# Patient Record
Sex: Male | Born: 1955 | Race: White | Hispanic: No | Marital: Single | Smoking: Former smoker
Health system: Southern US, Community
[De-identification: ages and names within clinical notes are randomized; demographics above are authoritative.]

## PROBLEM LIST (undated history)

## (undated) DIAGNOSIS — N400 Enlarged prostate without lower urinary tract symptoms: Secondary | ICD-10-CM

## (undated) DIAGNOSIS — I8390 Asymptomatic varicose veins of unspecified lower extremity: Secondary | ICD-10-CM

## (undated) DIAGNOSIS — E785 Hyperlipidemia, unspecified: Secondary | ICD-10-CM

## (undated) DIAGNOSIS — E291 Testicular hypofunction: Secondary | ICD-10-CM

## (undated) DIAGNOSIS — IMO0002 Reserved for concepts with insufficient information to code with codable children: Secondary | ICD-10-CM

## (undated) DIAGNOSIS — I2510 Atherosclerotic heart disease of native coronary artery without angina pectoris: Secondary | ICD-10-CM

## (undated) DIAGNOSIS — I10 Essential (primary) hypertension: Secondary | ICD-10-CM

## (undated) DIAGNOSIS — N529 Male erectile dysfunction, unspecified: Secondary | ICD-10-CM

## (undated) HISTORY — DX: Benign prostatic hyperplasia without lower urinary tract symptoms: N40.0

## (undated) HISTORY — PX: OTHER SURGICAL HISTORY: SHX169

## (undated) HISTORY — DX: Essential (primary) hypertension: I10

## (undated) HISTORY — DX: Reserved for concepts with insufficient information to code with codable children: IMO0002

## (undated) HISTORY — DX: Hyperlipidemia, unspecified: E78.5

## (undated) HISTORY — DX: Atherosclerotic heart disease of native coronary artery without angina pectoris: I25.10

## (undated) HISTORY — DX: Male erectile dysfunction, unspecified: N52.9

## (undated) HISTORY — DX: Asymptomatic varicose veins of unspecified lower extremity: I83.90

## (undated) HISTORY — DX: Testicular hypofunction: E29.1

---

## 2004-05-13 ENCOUNTER — Ambulatory Visit: Payer: Self-pay | Admitting: Endocrinology

## 2005-03-27 HISTORY — PX: OTHER SURGICAL HISTORY: SHX169

## 2005-10-04 ENCOUNTER — Ambulatory Visit: Payer: Self-pay | Admitting: Family Medicine

## 2005-10-20 ENCOUNTER — Ambulatory Visit: Payer: Self-pay | Admitting: Family Medicine

## 2005-11-03 ENCOUNTER — Ambulatory Visit: Payer: Self-pay | Admitting: Family Medicine

## 2005-12-04 ENCOUNTER — Ambulatory Visit: Payer: Self-pay | Admitting: Family Medicine

## 2006-01-01 ENCOUNTER — Ambulatory Visit: Payer: Self-pay | Admitting: Family Medicine

## 2006-01-13 ENCOUNTER — Emergency Department: Payer: Self-pay | Admitting: Emergency Medicine

## 2006-01-13 ENCOUNTER — Other Ambulatory Visit: Payer: Self-pay

## 2006-01-15 ENCOUNTER — Ambulatory Visit: Payer: Self-pay | Admitting: Family Medicine

## 2006-01-16 ENCOUNTER — Ambulatory Visit: Payer: Self-pay | Admitting: Cardiology

## 2006-06-25 ENCOUNTER — Encounter: Payer: Self-pay | Admitting: Family Medicine

## 2006-06-25 DIAGNOSIS — I10 Essential (primary) hypertension: Secondary | ICD-10-CM

## 2006-06-25 DIAGNOSIS — E291 Testicular hypofunction: Secondary | ICD-10-CM

## 2006-06-25 DIAGNOSIS — I2510 Atherosclerotic heart disease of native coronary artery without angina pectoris: Secondary | ICD-10-CM | POA: Insufficient documentation

## 2006-06-26 ENCOUNTER — Ambulatory Visit: Payer: Self-pay | Admitting: Family Medicine

## 2006-11-30 ENCOUNTER — Telehealth (INDEPENDENT_AMBULATORY_CARE_PROVIDER_SITE_OTHER): Payer: Self-pay | Admitting: *Deleted

## 2007-06-03 ENCOUNTER — Ambulatory Visit: Payer: Self-pay | Admitting: Family Medicine

## 2007-06-03 DIAGNOSIS — E785 Hyperlipidemia, unspecified: Secondary | ICD-10-CM | POA: Insufficient documentation

## 2007-06-03 DIAGNOSIS — H9319 Tinnitus, unspecified ear: Secondary | ICD-10-CM | POA: Insufficient documentation

## 2007-06-04 LAB — CONVERTED CEMR LAB
ALT: 37 units/L (ref 0–53)
Albumin: 4.4 g/dL (ref 3.5–5.2)
Basophils Relative: 1 % (ref 0–1)
Chloride: 109 meq/L (ref 96–112)
Cholesterol: 195 mg/dL (ref 0–200)
Eosinophils Absolute: 0.2 10*3/uL (ref 0.0–0.7)
HDL: 36 mg/dL — ABNORMAL LOW (ref >39)
LDL Cholesterol: 112 mg/dL — ABNORMAL HIGH (ref 0–99)
MCHC: 32.8 g/dL (ref 30.0–36.0)
MCV: 92.7 fL (ref 78.0–100.0)
Neutro Abs: 2.9 10*3/uL (ref 1.7–7.7)
Neutrophils Relative %: 53 % (ref 43–77)
Platelets: 207 10*3/uL (ref 150–400)
Potassium: 5 meq/L (ref 3.5–5.3)
RBC: 5.33 M/uL (ref 4.22–5.81)
Sodium: 143 meq/L (ref 135–145)
TSH: 1.563 microintl units/mL (ref 0.350–5.50)
Total CHOL/HDL Ratio: 5.4
Total Protein: 6.5 g/dL (ref 6.0–8.3)
Triglycerides: 234 mg/dL — ABNORMAL HIGH (ref <150)
VLDL: 47 mg/dL — ABNORMAL HIGH (ref 0–40)
WBC: 5.5 10*3/uL (ref 4.0–10.5)

## 2007-06-07 ENCOUNTER — Encounter: Payer: Self-pay | Admitting: Family Medicine

## 2007-06-19 ENCOUNTER — Ambulatory Visit: Payer: Self-pay | Admitting: Family Medicine

## 2008-07-22 ENCOUNTER — Ambulatory Visit: Payer: Self-pay | Admitting: Family Medicine

## 2008-07-27 LAB — CONVERTED CEMR LAB
ALT: 53 units/L (ref 0–53)
BUN: 16 mg/dL (ref 6–23)
Basophils Relative: 0 % (ref 0.0–3.0)
CO2: 33 meq/L — ABNORMAL HIGH (ref 19–32)
Calcium: 9.6 mg/dL (ref 8.4–10.5)
Chloride: 104 meq/L (ref 96–112)
Cholesterol: 205 mg/dL — ABNORMAL HIGH (ref 0–200)
Direct LDL: 132.5 mg/dL
Eosinophils Absolute: 0.1 10*3/uL (ref 0.0–0.7)
Eosinophils Relative: 3.1 % (ref 0.0–5.0)
HCT: 50 % (ref 39.0–52.0)
Lymphs Abs: 1.5 10*3/uL (ref 0.7–4.0)
MCHC: 34.1 g/dL (ref 30.0–36.0)
MCV: 88.2 fL (ref 78.0–100.0)
Monocytes Absolute: 0.4 10*3/uL (ref 0.1–1.0)
Platelets: 169 10*3/uL (ref 150.0–400.0)
Total Bilirubin: 0.9 mg/dL (ref 0.3–1.2)
Total CHOL/HDL Ratio: 7
Total Protein: 7.1 g/dL (ref 6.0–8.3)
Triglycerides: 219 mg/dL — ABNORMAL HIGH (ref 0.0–149.0)
WBC: 4.7 10*3/uL (ref 4.5–10.5)

## 2008-11-16 ENCOUNTER — Ambulatory Visit: Payer: Self-pay | Admitting: Family Medicine

## 2008-11-18 ENCOUNTER — Ambulatory Visit: Payer: Self-pay | Admitting: Family Medicine

## 2008-11-18 ENCOUNTER — Encounter: Admission: RE | Admit: 2008-11-18 | Discharge: 2008-11-18 | Payer: Self-pay | Admitting: Family Medicine

## 2008-11-18 ENCOUNTER — Telehealth: Payer: Self-pay | Admitting: Family Medicine

## 2008-11-23 LAB — CONVERTED CEMR LAB
Lymphocytes Relative: 25 % (ref 12–46)
Lymphs Abs: 1.6 10*3/uL (ref 0.7–4.0)
Neutrophils Relative %: 55 % (ref 43–77)
Platelets: 166 10*3/uL (ref 150–400)
WBC: 6.4 10*3/uL (ref 4.0–10.5)

## 2008-11-24 ENCOUNTER — Ambulatory Visit: Payer: Self-pay | Admitting: Family Medicine

## 2008-11-25 HISTORY — PX: OTHER SURGICAL HISTORY: SHX169

## 2008-11-25 LAB — CONVERTED CEMR LAB
Basophils Absolute: 0 10*3/uL (ref 0.0–0.1)
Basophils Relative: 0.5 % (ref 0.0–3.0)
Hemoglobin: 17.6 g/dL — ABNORMAL HIGH (ref 13.0–17.0)
Lymphocytes Relative: 30.2 % (ref 12.0–46.0)
Monocytes Relative: 11 % (ref 3.0–12.0)
Neutro Abs: 4 10*3/uL (ref 1.4–7.7)
RBC: 5.78 M/uL (ref 4.22–5.81)
WBC: 7 10*3/uL (ref 4.5–10.5)

## 2008-11-26 ENCOUNTER — Encounter: Payer: Self-pay | Admitting: Family Medicine

## 2008-12-01 ENCOUNTER — Ambulatory Visit: Payer: Self-pay | Admitting: Cardiology

## 2008-12-04 ENCOUNTER — Encounter: Payer: Self-pay | Admitting: Family Medicine

## 2008-12-09 ENCOUNTER — Telehealth: Payer: Self-pay | Admitting: Family Medicine

## 2008-12-09 ENCOUNTER — Encounter: Payer: Self-pay | Admitting: Family Medicine

## 2008-12-10 ENCOUNTER — Ambulatory Visit: Payer: Self-pay | Admitting: Otolaryngology

## 2008-12-14 ENCOUNTER — Ambulatory Visit: Payer: Self-pay | Admitting: Cardiology

## 2008-12-14 ENCOUNTER — Ambulatory Visit: Payer: Self-pay | Admitting: Otolaryngology

## 2008-12-22 ENCOUNTER — Inpatient Hospital Stay: Payer: Self-pay | Admitting: Otolaryngology

## 2008-12-25 ENCOUNTER — Ambulatory Visit: Payer: Self-pay | Admitting: Radiation Oncology

## 2008-12-25 ENCOUNTER — Encounter: Payer: Self-pay | Admitting: Family Medicine

## 2008-12-31 ENCOUNTER — Encounter: Payer: Self-pay | Admitting: Family Medicine

## 2009-01-21 ENCOUNTER — Ambulatory Visit: Payer: Self-pay | Admitting: Radiation Oncology

## 2009-01-25 ENCOUNTER — Ambulatory Visit: Payer: Self-pay | Admitting: Radiation Oncology

## 2009-01-25 ENCOUNTER — Ambulatory Visit: Payer: Self-pay | Admitting: Oncology

## 2009-02-24 ENCOUNTER — Ambulatory Visit: Payer: Self-pay | Admitting: Radiation Oncology

## 2009-02-24 ENCOUNTER — Ambulatory Visit: Payer: Self-pay | Admitting: Oncology

## 2009-03-27 ENCOUNTER — Ambulatory Visit: Payer: Self-pay | Admitting: Radiation Oncology

## 2009-03-27 ENCOUNTER — Ambulatory Visit: Payer: Self-pay | Admitting: Oncology

## 2009-04-27 ENCOUNTER — Ambulatory Visit: Payer: Self-pay | Admitting: Oncology

## 2009-04-27 ENCOUNTER — Ambulatory Visit: Payer: Self-pay | Admitting: Radiation Oncology

## 2009-05-25 ENCOUNTER — Ambulatory Visit: Payer: Self-pay | Admitting: Radiation Oncology

## 2009-05-25 ENCOUNTER — Ambulatory Visit: Payer: Self-pay | Admitting: Oncology

## 2009-06-25 ENCOUNTER — Ambulatory Visit: Payer: Self-pay | Admitting: Oncology

## 2009-06-28 ENCOUNTER — Encounter: Payer: Self-pay | Admitting: Family Medicine

## 2009-07-25 ENCOUNTER — Ambulatory Visit: Payer: Self-pay | Admitting: Oncology

## 2009-08-17 ENCOUNTER — Ambulatory Visit: Payer: Self-pay | Admitting: Oncology

## 2009-08-19 ENCOUNTER — Encounter: Payer: Self-pay | Admitting: Family Medicine

## 2009-08-19 ENCOUNTER — Ambulatory Visit: Payer: Self-pay | Admitting: Oncology

## 2009-08-25 ENCOUNTER — Ambulatory Visit: Payer: Self-pay | Admitting: Oncology

## 2009-09-28 ENCOUNTER — Ambulatory Visit: Payer: Self-pay | Admitting: Family Medicine

## 2009-09-28 DIAGNOSIS — Z8589 Personal history of malignant neoplasm of other organs and systems: Secondary | ICD-10-CM | POA: Insufficient documentation

## 2009-09-30 LAB — CONVERTED CEMR LAB
Albumin: 4.4 g/dL (ref 3.5–5.2)
Calcium: 9.6 mg/dL (ref 8.4–10.5)
Cholesterol: 205 mg/dL — ABNORMAL HIGH (ref 0–200)
Glucose, Bld: 97 mg/dL (ref 70–99)
Potassium: 4.2 meq/L (ref 3.5–5.1)
Total CHOL/HDL Ratio: 4
Triglycerides: 154 mg/dL — ABNORMAL HIGH (ref 0.0–149.0)

## 2010-02-15 ENCOUNTER — Ambulatory Visit: Payer: Self-pay | Admitting: Oncology

## 2010-02-24 ENCOUNTER — Ambulatory Visit: Payer: Self-pay | Admitting: Oncology

## 2010-03-25 ENCOUNTER — Ambulatory Visit: Payer: Self-pay | Admitting: Otolaryngology

## 2010-04-26 NOTE — Letter (Signed)
Summary: [facility]   [facility]   Imported By: Lanelle Bal 07/13/2009 13:24:47  _____________________________________________________________________  External Attachment:    Type:   Image     Comment:   External Document

## 2010-04-26 NOTE — Assessment & Plan Note (Signed)
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 Bili:  0.9 (07/22/2008 9:27:31 AM)Alk Phos:  63 (07/22/2008 9:27:31 AM)  is due for check of that   is clear of cancer for now  feels just fine  taste is coming back - appetite too  is taking good care of himself  diet is very good - did put back on some wt ( lost 32 lb entirely)     Allergies: 1)  ! Pneumovax 23  Past History:  Past Surgical History: Last updated: 01/14/2009 TSA (1960's) Hosp. for chest pain- cath (2007) 9/10 CT of neck - enlarged lymph node 9/10 CT of chest - atelectasis  9/10 LN R neck- inconclusive fine needle bx squamous cell carcinoma / metastatic - found in neck 10/10- radical neck dissection  Family History: Last updated: 07/22/2008 father, CAD, melanoma no prostate problems   Social History: Last updated: 06/03/2007 Former Smoker 1-2 beers per day occas  Risk Factors: Smoking Status: quit (06/03/2007)  Past Medical History: Hypertension cholesterol CAD varicose vein L  lower leg- pain BPH low testosterone with replacement  squamous cell carcinoma (neck mass) - surgery- radical neck dissection/ radiation and chemo    urol- DR Orson Slick  ENT - Bertha Stakes surgeon  Review of Systems General:  Denies fatigue, fever, loss of appetite, and malaise. Eyes:  Denies blurring, eye irritation, and eye pain. ENT:  Denies difficulty swallowing, earache, sinus pressure, and sore throat. CV:  Denies chest pain or discomfort, palpitations, and swelling of feet. Resp:  Denies cough, shortness of breath, and wheezing. GI:  Denies abdominal pain, change in bowel habits, and indigestion. GU:  Denies dysuria and urinary frequency. MS:  Denies joint pain and muscle aches. Derm:  Denies itching, lesion(s), poor wound healing, and rash. Neuro:  Denies numbness and tingling. Psych:  Denies anxiety and depression. Endo:  Denies cold intolerance, excessive thirst, excessive urination, and heat intolerance. Heme:  Denies abnormal bruising and bleeding.  Physical Exam  General:  wt loss noted and well appearing  Head:  normocephalic, atraumatic, and no abnormalities observed.   Eyes:  vision grossly intact, pupils equal, pupils round, and pupils reactive to light.  no conjunctival pallor, injection or icterus  Ears:  R ear normal and L ear normal.   Nose:  no nasal discharge.   Mouth:  pharynx pink and moist.  Neck:  well healed L scar supple with full rom and no masses or thyromegally, no JVD or carotid bruit  Lungs:  somewhat distant bs but overall clear no crackles or wheeze no rales heard at all Heart:  Normal rate and regular rhythm. S1 and S2 normal without gallop, murmur, click, rub or other extra sounds. Abdomen:  Bowel sounds positive,abdomen soft and non-tender without masses, organomegaly or hernias noted. no renal bruits  Msk:  No deformity or scoliosis noted of thoracic or lumbar spine.   Pulses:  R and L carotid,radial,femoral,dorsalis pedis and posterior  tibial pulses are full and equal bilaterally Extremities:  No clubbing, cyanosis, edema, or deformity noted with normal full range of motion of all joints.   Neurologic:  sensation intact to light touch, gait normal, and DTRs symmetrical and normal.   Skin:  Intact without suspicious lesions or rashes Cervical Nodes:  No lymphadenopathy noted Psych:  normal affect, talkative and pleasant    Impression & Recommendations:  Problem # 1:  HYPERLIPIDEMIA (ICD-272.4) Assessment Unchanged  lipids today on better diet  rev low sat fat diet  Orders: TLB-Lipid Panel (80061-LIPID) TLB-ALT (SGPT) (84460-ALT) TLB-AST (SGOT) (84450-SGOT) TLB-Renal Function Panel (80069-RENAL) Venipuncture (16109) Prescription Created Electronically [phone])  Labs Reviewed: SGOT: 30 (07/22/2008)   SGPT: 53 (07/22/2008)   HDL:31.20 (07/22/2008), 36 (06/03/2007)  LDL:112 (06/03/2007)  Chol:205 (07/22/2008), 195 (06/03/2007)  Trig:219.0 (07/22/2008), 234 (06/03/2007)  Problem # 2:  HYPERTENSION (ICD-401.9) Assessment: Improved  this is well controlled on less enalapril - continue 10 mg dose lab today urged to mt current wt and start with exercise  His updated medication list for this problem includes:    Hydrochlorothiazide 50 Mg Tabs (Hydrochlorothiazide) .Marland Kitchen... 1 by mouth each am    Enalapril Maleate 10 Mg Tabs (Enalapril maleate) .Marland Kitchen... 1 by mouth once daily  Orders: TLB-Lipid Panel (80061-LIPID) TLB-ALT (SGPT) (84460-ALT) TLB-AST (SGOT) (84450-SGOT) TLB-Renal Function Panel (80069-RENAL) Venipuncture (09811) Prescription Created Electronically [phone])  BP today: 130/80 Prior BP: 118/80 (11/24/2008)  Labs Reviewed: K+: 4.0 (07/22/2008) Creat: : 1.2 (11/24/2008)   Chol: 205 (07/22/2008)   HDL: 31.20 (07/22/2008)   LDL: 112 (06/03/2007)   TG: 219.0 (07/22/2008)  Problem # 3:  CARCINOMA, SQUAMOUS CELL (ICD-199.1) Assessment: New  of neck- is cancer free after radical neck dissection and also chemo  and radiation  is doing better with appetite returning  continue oncol f/u  Orders: Prescription Created Electronically [phone])  Complete Medication List: 1)  Hydrochlorothiazide 50 Mg Tabs (Hydrochlorothiazide) .Marland Kitchen.. 1 by mouth each am 2)  Enalapril Maleate 10 Mg Tabs (Enalapril maleate) .Marland Kitchen.. 1 by mouth once daily 3)  Bayer Aspirin Ec Low Dose 81 Mg Tbec (Aspirin) .Marland Kitchen.. 1 daily by mouth 4)  Avodart 0.5 Mg Caps (Dutasteride) .... Take 1 tablet by mouth once a day 5)  Uroxatral 10 Mg Xr24h-tab (Alfuzosin hcl) .... Take 1 tablet by mouth once a day 6)  Testosterone 300mg   .... 1 injection monthly 7)  Fish Oil 1000 Mg  .Marland Kitchen.. 4 by mouth once daily  Patient Instructions: 1)  checking cholesterol today  2)  bp is good  3)  cut your enalapril to 10 mg daily  4)  no other changes  5)  follow up in about 6 months  6)  keep working on healthy diet and exercise  Prescriptions: ENALAPRIL MALEATE 10 MG TABS (ENALAPRIL MALEATE) 1 by mouth once daily  #30 x 11   Entered and Authorized by:   Judith Part MD  Signed by:   Judith Part MD on 09/28/2009   Method used:   Electronically to        [facility] (retail)       [address]       [place], [place]  [address]       Ph: [phone]       Fax: [phone]   RxID:   [id number]   Current Allergies (reviewed today): ! PNEUMOVAX 23

## 2010-04-26 NOTE — Letter (Signed)
Summary: [facility]  [facility]   Imported By: Lanelle Bal 08/31/2009 10:04:00  _____________________________________________________________________  External Attachment:    Type:   Image     Comment:   External Document

## 2010-07-29 ENCOUNTER — Other Ambulatory Visit: Payer: Self-pay | Admitting: *Deleted

## 2010-07-29 MED ORDER — HYDROCHLOROTHIAZIDE 50 MG PO TABS: 50.0000 mg | ORAL_TABLET | Freq: Every day | ORAL | Status: DC

## 2010-08-12 ENCOUNTER — Ambulatory Visit: Payer: Self-pay | Admitting: Oncology

## 2010-08-12 NOTE — Assessment & Plan Note (Signed)
[facility] OFFICE NOTE   NAME:Terry Lowe                      MRN:          [id number]  DATE:01/16/2006                            DOB:          14-Apr-1955    I was asked by Dr. Milinda Antis to evaluate Terry Lowe, a very pleasant 55-year-  old divorced white male with a recent history of chest discomfort,  hypertension, and mixed hyperlipidemia.   This past week, Terry Lowe was tapered off of his metoprolol at 100 b.i.d.  He dropped to 100 mg per day.  He awoke this past weekend with his heart  racing.  He went to [facility] and apparently was  told that he had a rapid heartbeat but was not admitted from the emergency  room.  We are trying to obtain the records.   He also was in [place] in September 26, 2005, fishing and was admitted to the  hospital with chest discomfort.  He underwent a cardiologic evaluation and  also a catheterization which showed minimal epicardial disease or plaquing.  He had normal left ventricular function that was hyperdynamic, EF of 75%.   He was placed on metoprolol at that time and enalapril.   He is now established with Dr. Milinda Antis.  She placed him on hydrochlorothiazide  for his blood pressure and tapered off his metoprolol.   PAST MEDICAL HISTORY:  Remarkable for being on:  1. Omeprazole 40 mg a day.  2. Android 20 mg a day.  3. Aspirin 81 mg a day,  4. Hydrochlorothiazide 50 mg a day.  5. Enalapril 30 mg a day.   He has no known drug allergies.   He quit smoking 2 years ago.  He was not a heavy smoker.  He has about two  drinks of alcohol a day.  He drinks about two cups of caffeinated beverages  a day.  He enjoys walking, golfing, skiing, and fishing.   FAMILY HISTORY:  There is no premature history of coronary disease in his  family.   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  He is divorced and has  three children.  He has had a lot of  associated stress with his divorce, and also with his children - like all of  [place]!   REVIEW OF SYSTEMS:  Is essentially negative.   PHYSICAL EXAMINATION:  VITAL SIGNS:  His blood pressure today is 132/88 in  the left arm, 130/88 in the right arm.  His pulse is 87 and regular.  He  weighs 200 pounds, he is 6 feet tall.  HEENT:  Normocephalic, atraumatic.  He has a beard.  PERRLA, extraocular  movements intact, sclerae clear.  Facial symmetry is normal, dentition is  satisfactory.  NECK:  Carotid upstrokes are equal bilaterally without bruits.  There is no  JVD.  Thyromegaly is not enlarged, trachea is midline.  LUNGS:  Clear.  HEART:  Reveals a regular rate and rhythm without gallop.  He has a soft  systolic murmur along the left sternal  border.  ABDOMEN:  Soft with good bowel sounds.  There is no midline bruit, there is  no hepatomegaly.  EXTREMITIES:  Reveal no cyanosis, clubbing or edema.  Pulses are intact.  NEUROLOGIC:  Intact.   His EKG today shows sinus rhythm with no significant changes.   His total cholesterol on Vytorin 10/20 was 94, triglycerides 132, HDL 25,  LDL 43.  This was discontinued and he was placed on fish oil.  I do not have  baseline values.   ASSESSMENT:  1. Essential hypertension.  2. Tachycardic secondary to beta blocker withdrawal, which is now      resolved.  3. Mixed hyperlipidemia.  His HDL being low is probably mostly genetic.  4. Relatively sedentary lifestyle.   RECOMMENDATION:  1. Continue current medications.  2. Cardio or aerobic exercise 3 hours per week.  3. Follow up lipids with Dr. Milinda Antis.   We reviewed his diet at length.  It sounds like he is eating a very low  saturated fat diet with lots of fish and no major carbohydrates.   We will see him back on a p.r.n. basis.  I also told him to continue his  aspirin.      Thomas C. Daleen Squibb, MD, [facility]     TCW/MedQ  DD:  01/16/2006  DT:  01/17/2006   Job #:  [id number]   cc:   Marne A. Milinda Antis, MD

## 2010-08-16 ENCOUNTER — Ambulatory Visit: Payer: Self-pay | Admitting: Oncology

## 2010-08-26 ENCOUNTER — Ambulatory Visit: Payer: Self-pay | Admitting: Oncology

## 2010-09-12 ENCOUNTER — Encounter: Payer: Self-pay | Admitting: Cardiovascular Disease

## 2010-10-13 ENCOUNTER — Telehealth: Payer: Self-pay | Admitting: *Deleted

## 2010-10-13 MED ORDER — ENALAPRIL MALEATE 10 MG PO TABS: 10.0000 mg | ORAL_TABLET | Freq: Every day | ORAL | Status: DC

## 2011-02-14 ENCOUNTER — Ambulatory Visit: Payer: Self-pay | Admitting: Oncology

## 2011-02-25 ENCOUNTER — Ambulatory Visit: Payer: Self-pay | Admitting: Oncology

## 2011-04-06 ENCOUNTER — Other Ambulatory Visit: Payer: Self-pay

## 2011-04-06 NOTE — Telephone Encounter (Signed)
Pt left message wants refill HCTZ 50 mg and Enalapril 10 mg. Pt last seen 09/28/09.Please advise.Pt uses [facility] in [place].

## 2011-04-06 NOTE — Telephone Encounter (Signed)
Make f/u appt and then can refil both to get by until he comes in Thanks

## 2011-04-07 MED ORDER — ENALAPRIL MALEATE 10 MG PO TABS: 10.0000 mg | ORAL_TABLET | Freq: Every day | ORAL | Status: DC

## 2011-04-07 MED ORDER — HYDROCHLOROTHIAZIDE 50 MG PO TABS: 50.0000 mg | ORAL_TABLET | Freq: Every day | ORAL | Status: DC

## 2011-04-07 NOTE — Telephone Encounter (Signed)
[facility] request refill HCTZ 50 mg #30 x 0 and Enalapril 10 mg #30 x 0. Pt scheduled appt to see Dr Milinda Antis 04/28/11.

## 2011-04-27 ENCOUNTER — Encounter: Payer: Self-pay | Admitting: Family Medicine

## 2011-04-28 ENCOUNTER — Encounter: Payer: Self-pay | Admitting: Family Medicine

## 2011-04-28 ENCOUNTER — Ambulatory Visit (INDEPENDENT_AMBULATORY_CARE_PROVIDER_SITE_OTHER): Payer: Self-pay | Admitting: Family Medicine

## 2011-04-28 VITALS — BP 118/72 | HR 77 | Temp 98.1°F | Wt 199.0 lb

## 2011-04-28 DIAGNOSIS — E785 Hyperlipidemia, unspecified: Secondary | ICD-10-CM

## 2011-04-28 DIAGNOSIS — E291 Testicular hypofunction: Secondary | ICD-10-CM

## 2011-04-28 DIAGNOSIS — I10 Essential (primary) hypertension: Secondary | ICD-10-CM

## 2011-04-28 DIAGNOSIS — C801 Malignant (primary) neoplasm, unspecified: Secondary | ICD-10-CM

## 2011-04-28 LAB — BASIC METABOLIC PANEL
CO2: 30 mEq/L (ref 19–32)
Calcium: 9.4 mg/dL (ref 8.4–10.5)
Chloride: 102 mEq/L (ref 96–112)
Glucose, Bld: 108 mg/dL — ABNORMAL HIGH (ref 70–99)
Potassium: 3.6 mEq/L (ref 3.5–5.1)
Sodium: 139 mEq/L (ref 135–145)

## 2011-04-28 MED ORDER — HYDROCHLOROTHIAZIDE 50 MG PO TABS: 50.0000 mg | ORAL_TABLET | Freq: Every day | ORAL | Status: DC

## 2011-04-28 MED ORDER — ENALAPRIL MALEATE 10 MG PO TABS: 10.0000 mg | ORAL_TABLET | Freq: Every day | ORAL | Status: DC

## 2011-04-28 NOTE — Assessment & Plan Note (Signed)
No reoccurance (though did have a small skin ca tx)  Sees oncology regularly

## 2011-04-28 NOTE — Assessment & Plan Note (Signed)
Pt is watching diet / but without ins cannot afford to check it at this time  Disc goals and low sat fat diet

## 2011-04-28 NOTE — Assessment & Plan Note (Signed)
bp in fair control at this time  No changes needed  Disc lifstyle change with low sodium diet and exercise   Called med to pharmacy [facility]mp today (as much lab as he can afford to do)

## 2011-04-28 NOTE — Progress Notes (Signed)
Subjective:    Patient ID: Terry Lowe, male    DOB: [birth year], 56 y.o.   MRN: [id number]  HPI Here for f/u of chronic med problems  Is feeling fine  Nothing new going on medically   Had a spot of skin cancer- "zapped him with radiation"-- also squamous cell   bp is  118/72   Today No cp or palpitations or headaches or edema  No side effects to medicines    All to pneumonvax- afraid to get flu shot and also cannot afford one   Wt is up from last visit but down from peak at home -- was 210     (lowest 182) -- with cancer  Sense of taste is coming back    Hx of squamous cell ca of neck - goes for f/u every 6 months   Due for labs- for HTN Is out of $ and his insurance got cancelled  This has been problematic- cannot afford much in terms of labs or follow up   Lab Results  Component Value Date   CHOL 205* 09/28/2009   HDL 49.30 09/28/2009   LDLCALC 112* 06/03/2007   LDLDIRECT 133.7 09/28/2009   TRIG 154.0* 09/28/2009   CHOLHDL 4 09/28/2009   Diet - fair overall  Exercise very active job but no extra   Testosterone - was taking injections-- urology  Stopped it  Last psa was good  Still sees urology  Wants to do his testosterone level -send to Dr Orson Slick   Patient Active Problem List  Diagnoses  . CARCINOMA, SQUAMOUS CELL  . TESTOSTERONE DEFICIENCY  . HYPERLIPIDEMIA  . TINNITUS  . HYPERTENSION  . CAD   Past Medical History  Diagnosis Date  . HTN (hypertension)   . CAD (coronary artery disease)   . Varicose vein     L lower leg- pain  . BPH (benign prostatic hyperplasia)   . Squamous cell carcinoma     neck mass- surgery- radical neck dissection/radiation and chemo  . Low testosterone     with replacement  . Hyperlipidemia    Past Surgical History  Procedure Date  . Tsa 1960s  . Cath (other) 2007    hospital for chest pain  . Ct of neck 9/10    enlarged lymph node  . Ct of chest 9/10    atelectasis  . Ln r neck 9/10    inconclusive fine needle bx  .  Squamous cell carcinoma/metastatic     foundin neck 10/10 radical neck dissection   History  Substance Use Topics  . Smoking status: Former Games developer  . Smokeless tobacco: Not on file  . Alcohol Use: Yes     1-2 beers per day occas   Family History  Problem Relation Age of Onset  . Coronary artery disease Father   . Melanoma Father   . Heart disease Father     CAD  . Cancer Father     melanoma   Allergies  Allergen Reactions  . Pneumococcal Vaccine Polyvalent     REACTION: itching for over a year  . Statins     REACTION: myalgia severe   Current Outpatient Prescriptions on File Prior to Visit  Medication Sig Dispense Refill  . alfuzosin (UROXATRAL) 10 MG 24 hr tablet Take 10 mg by mouth daily.        Marland Kitchen aspirin (BAYER ASPIRIN EC LOW DOSE) 81 MG EC tablet Take 81 mg by mouth daily.        Marland Kitchen  dutasteride (AVODART) 0.5 MG capsule Take 0.5 mg by mouth daily.        . NON FORMULARY TESTOSTERONE 300MG  (1 injection monthly)       . Omega-3 Fatty Acids (FISH OIL) 1000 MG CAPS Take by mouth. Take 4             Review of Systems Review of Systems  Constitutional: Negative for fever, appetite change, fatigue and unexpected weight change.  Eyes: Negative for pain and visual disturbance.  Respiratory: Negative for cough and shortness of breath.   Cardiovascular: Negative for cp or palpitations    Gastrointestinal: Negative for nausea, diarrhea and constipation.  Genitourinary: Negative for urgency and frequency.  Skin: Negative for pallor or rash   Neurological: Negative for weakness, light-headedness, numbness and headaches.  Hematological: Negative for adenopathy. Does not bruise/bleed easily.  Psychiatric/Behavioral: Negative for dysphoric mood. The patient is not nervous/anxious.  -he is angry/ resentful in general about his loss of insurance and inability to get more        Objective:   Physical Exam  Constitutional: He appears well-developed and well-nourished. No  distress.  HENT:  Head: Normocephalic and atraumatic.  Mouth/Throat: Oropharynx is clear and moist.  Eyes: Conjunctivae and EOM are normal. Pupils are equal, round, and reactive to light. No scleral icterus.  Neck: Normal range of motion. Neck supple. No JVD present. Carotid bruit is not present. No thyromegaly present.       Post sugical changes noted   Cardiovascular: Normal rate, regular rhythm, normal heart sounds and intact distal pulses.  Exam reveals no gallop.   Pulmonary/Chest: Effort normal and breath sounds normal. No respiratory distress. He has no wheezes. He exhibits no tenderness.  Abdominal: Soft. Bowel sounds are normal. He exhibits no distension and no mass. There is no tenderness.  Musculoskeletal: Normal range of motion. He exhibits no edema and no tenderness.  Lymphadenopathy:    He has no cervical adenopathy.  Neurological: He is alert. He has normal reflexes. No cranial nerve deficit. He exhibits normal muscle tone. Coordination normal.  Skin: Skin is warm and dry. No rash noted. No erythema. No pallor.  Psychiatric: His behavior is normal. Judgment and thought content normal.       Pt's affect is somewhat blunted- and at other moments seems to be generally resentful and angry He is a bit difficult to communicate with for this reason            Assessment & Plan:

## 2011-04-28 NOTE — Patient Instructions (Signed)
No change in medicines I sent your blood pressure medicines to the pharmacy  Lab today basic met panel and testosterone level  Try to eat healthy and get regular exercise

## 2011-04-28 NOTE — Assessment & Plan Note (Signed)
Pt stopped shots with Dr Orson Slick - but wants level checked and sent to him Prostate care is also there- per pt stable with good psa last time

## 2011-08-28 ENCOUNTER — Ambulatory Visit: Payer: Self-pay | Admitting: Oncology

## 2011-09-25 ENCOUNTER — Ambulatory Visit: Payer: Self-pay | Admitting: Oncology

## 2011-10-09 ENCOUNTER — Ambulatory Visit: Payer: Self-pay | Admitting: Oncology

## 2011-10-26 ENCOUNTER — Ambulatory Visit: Payer: Self-pay | Admitting: Oncology

## 2012-03-14 ENCOUNTER — Emergency Department: Payer: Self-pay | Admitting: Internal Medicine

## 2012-03-14 LAB — BASIC METABOLIC PANEL
Anion Gap: 8 (ref 7–16)
Calcium, Total: 9.2 mg/dL (ref 8.5–10.1)
Co2: 25 mmol/L (ref 21–32)
EGFR (African American): >60
Sodium: 138 mmol/L (ref 136–145)

## 2012-03-14 LAB — CBC
HCT: 46 % (ref 40.0–52.0)
HGB: 15.9 g/dL (ref 13.0–18.0)
Platelet: 178 10*3/uL (ref 150–440)
RDW: 12.5 % (ref 11.5–14.5)
WBC: 4.8 10*3/uL (ref 3.8–10.6)

## 2012-03-14 LAB — CK TOTAL AND CKMB (NOT AT ARMC)
CK, Total: 251 U/L — ABNORMAL HIGH (ref 35–232)
CK-MB: 3.3 ng/mL (ref 0.5–3.6)

## 2012-03-26 IMAGING — PT NM PET TUM IMG RESTAG (PS) SKULL BASE T - THIGH
5 series · 25 of 25 positions shown · non-contrast
Comparison: none

REASON FOR EXAM: Head Neck CA Restage
COMMENTS:

PROCEDURE:     PET - PET/CT RESTG HEAD/NECK CA  - August 17, 2009 [DATE]
RESULT:     Comparison is made to a prior study dated 12/10/2008.
TECHNIQUE: Neck protocol PET/CT was performed status post left forearm
injection of 12.48 mCi of F-18 labeled fluorodeoxyglucose. The patient's
fasting blood glucose is measured at 115 mg/dL. Included with this study is
a non- attenuated CT.  Also performed were fusion images utilizing the PET
and CT.

[Series 3: ct wb 3.0 b30f · axial · 3.0mm · 0.98mm/px · z∈[-1413,-427]mm · 11 of 494 slices shown]
[im 1/494]
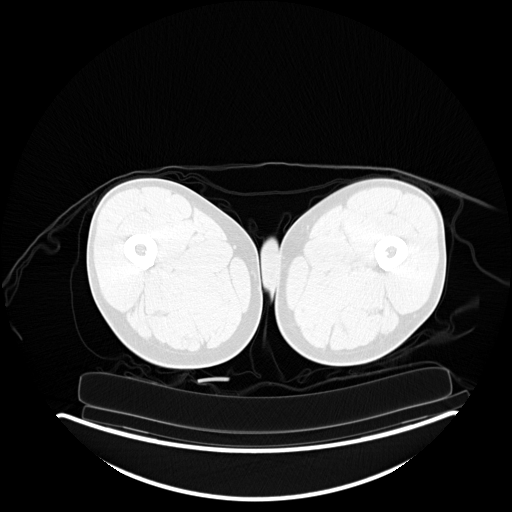
[im 50/494]
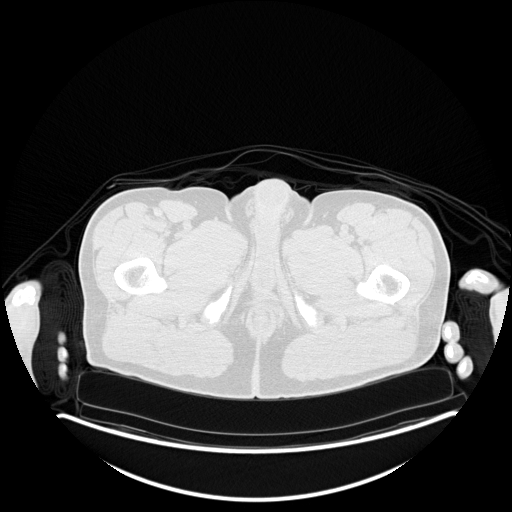
[im 99/494]
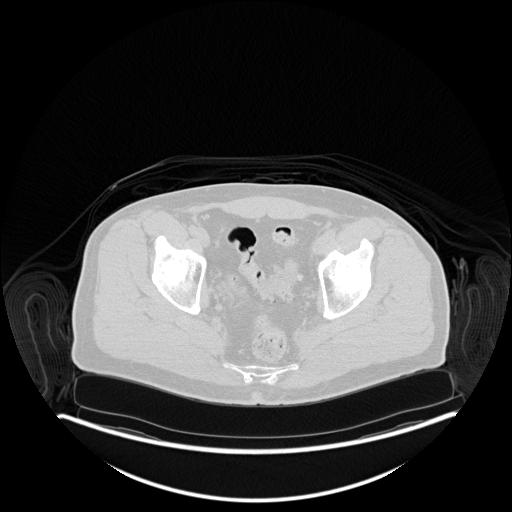
[im 148/494]
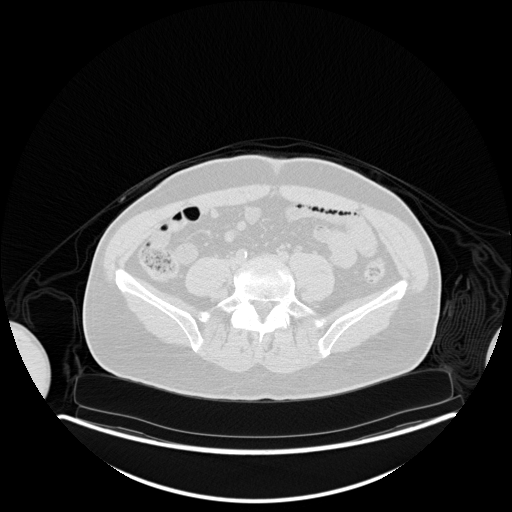
[im 198/494]
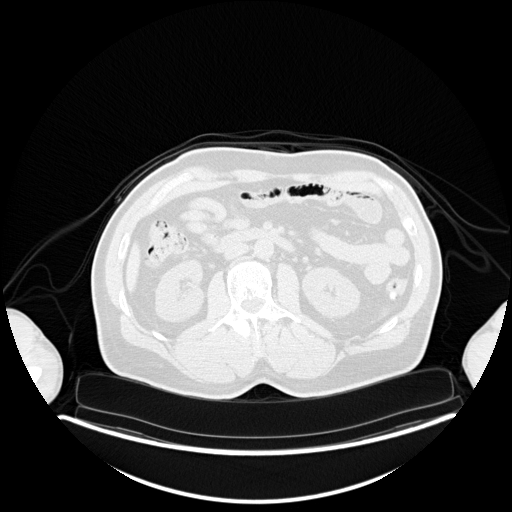
[im 247/494]
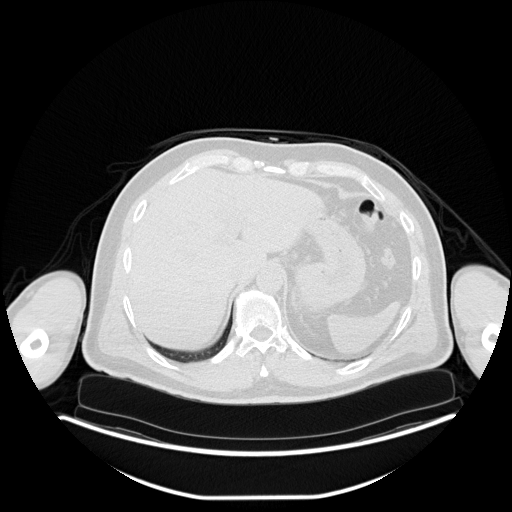
[im 296/494]
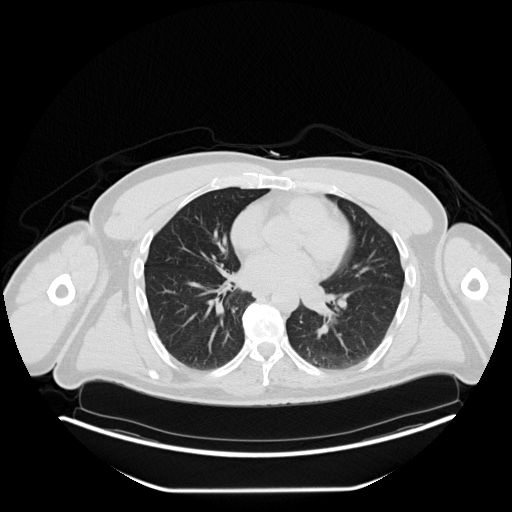
[im 346/494]
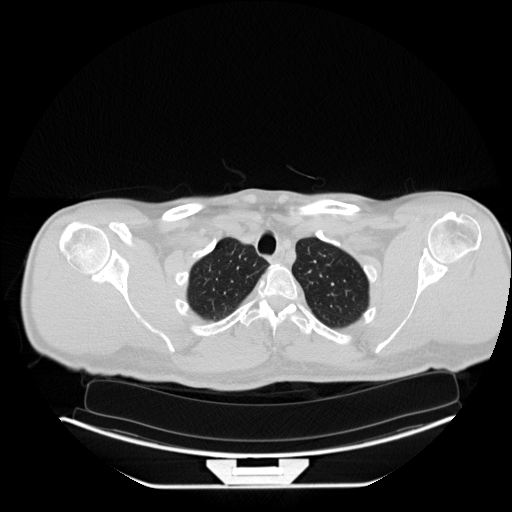
[im 395/494]
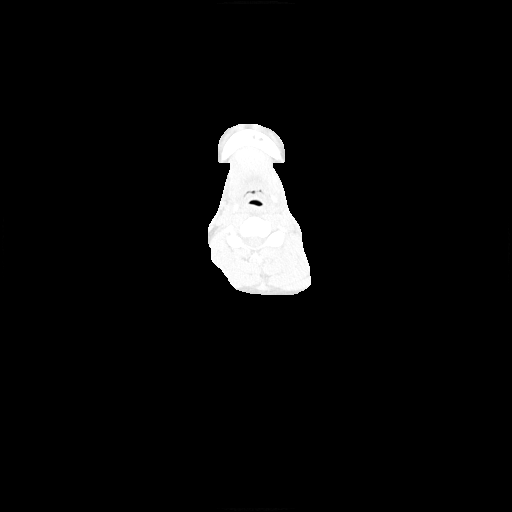
[im 444/494]
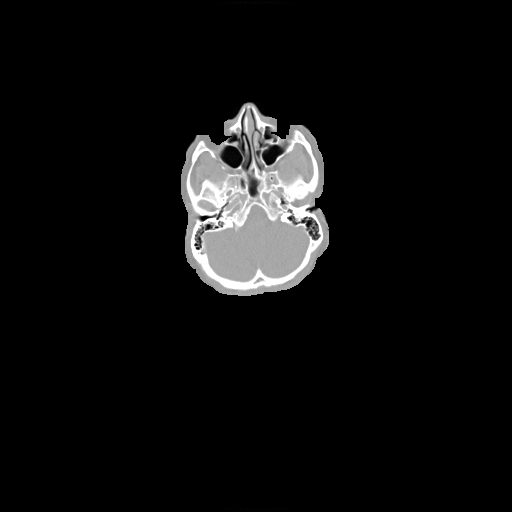
[im 494/494]
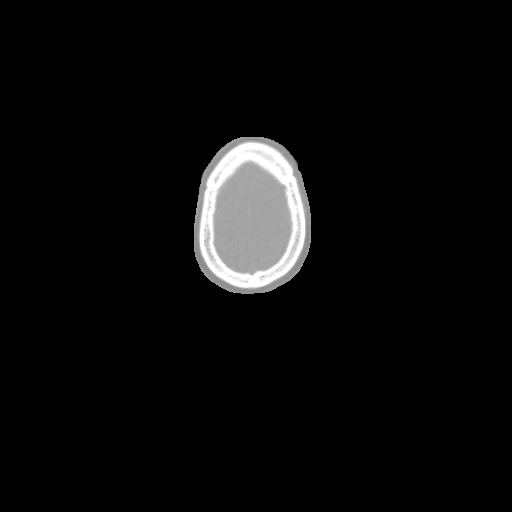

[Series 102: pet wb · axial · 5.0mm · 4.07mm/px · z∈[-1411,-427]mm · 7 of 329 slices shown]
[im 1/329]
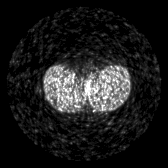
[im 55/329]
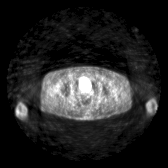
[im 110/329]
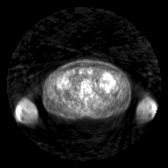
[im 165/329]
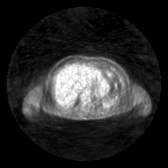
[im 219/329]
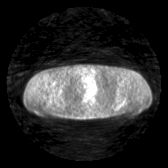
[im 274/329]
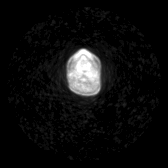
[im 329/329]
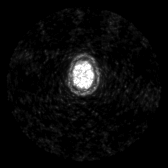

[Series 603: pet axial · 4 of 191 slices shown]
[im 1/191]
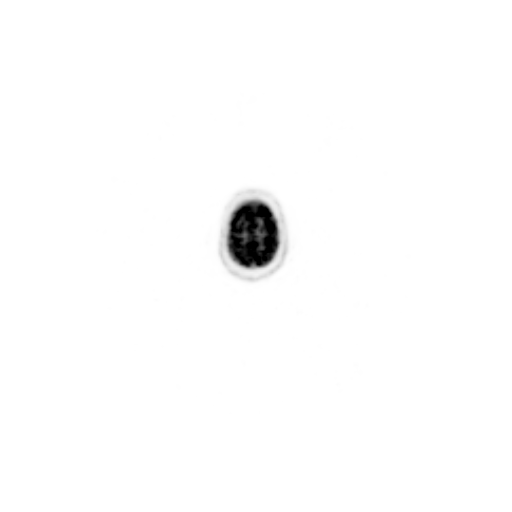
[im 64/191]
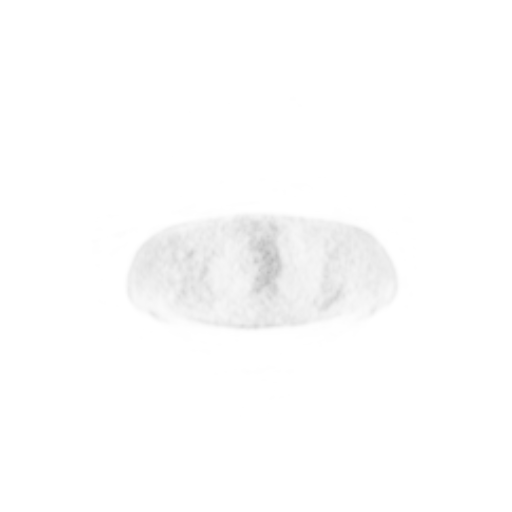
[im 127/191]
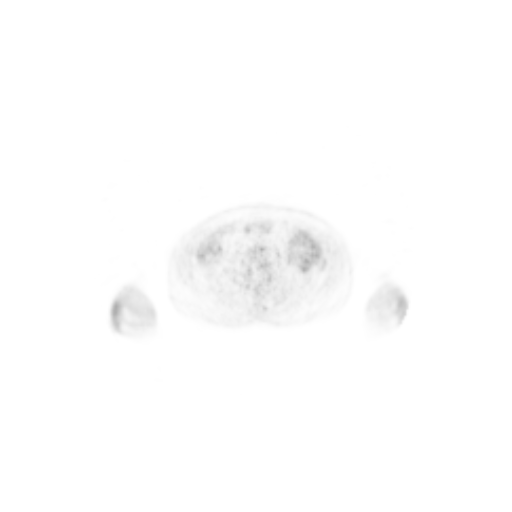
[im 191/191]
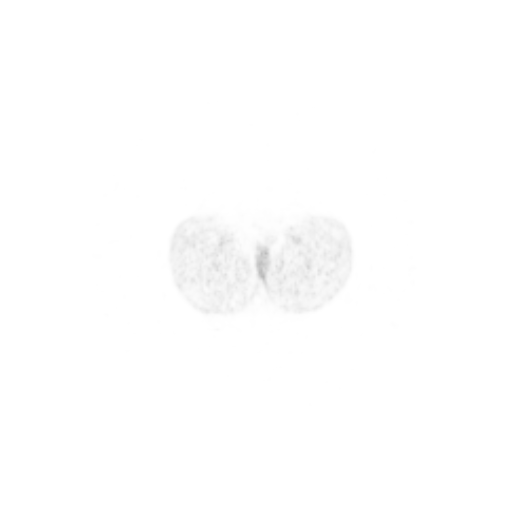

[Series 604: pet coronal · 1 of 65 slices shown]
[im 1/65]
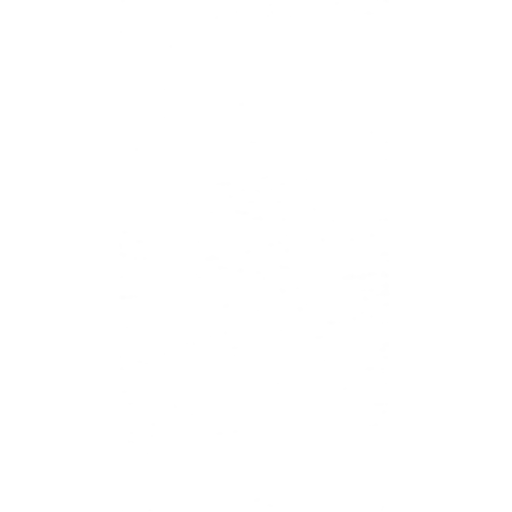

[Series 605: pet sagittal · 2 of 114 slices shown]
[im 1/114]
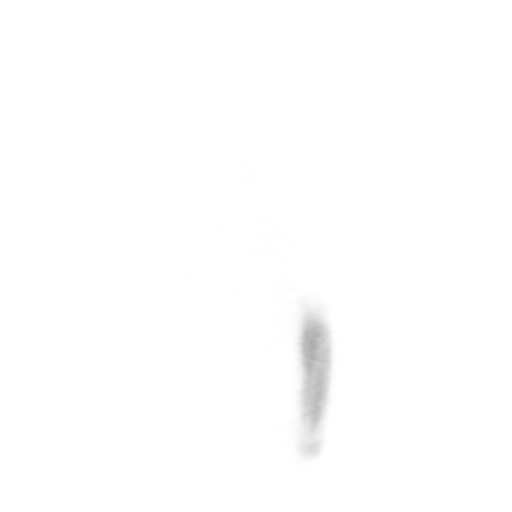
[im 114/114]
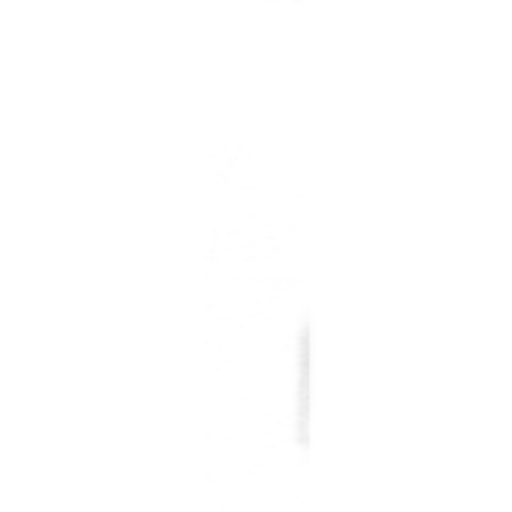

[25 of 25 positions shown; findings below may reference images not displayed]

Appropriate radiotracer activity is identified in the region of the brain,
heart, liver, kidneys, spleen and urinary bladder. Previously described area
of hypermetabolic activity along the left lateral portion of the neck on the
previous study has near completely resolved when compared to the present
study. There is no evidence of appreciable hypermetabolic activity
appreciated in this region. No further abnormal foci of hypermetabolic
activity are identified.
IMPRESSION: Near-complete resolution of the previously described area of nodular
hypermetabolic activity within the left lateral portion of the neck when
compared to the previous study. No new foci are identified.

## 2012-03-27 ENCOUNTER — Ambulatory Visit: Payer: Self-pay | Admitting: Oncology

## 2012-05-20 ENCOUNTER — Other Ambulatory Visit: Payer: Self-pay | Admitting: *Deleted

## 2012-05-20 ENCOUNTER — Other Ambulatory Visit: Payer: Self-pay | Admitting: Family Medicine

## 2012-05-20 MED ORDER — HYDROCHLOROTHIAZIDE 50 MG PO TABS: 50.0000 mg | ORAL_TABLET | Freq: Every day | ORAL | Status: DC

## 2012-05-20 MED ORDER — ENALAPRIL MALEATE 10 MG PO TABS: 10.0000 mg | ORAL_TABLET | Freq: Every day | ORAL | Status: DC

## 2013-06-10 ENCOUNTER — Other Ambulatory Visit: Payer: Self-pay | Admitting: Family Medicine

## 2013-06-10 NOTE — Telephone Encounter (Signed)
Electronic refill request, no recent/future appt., please advise  

## 2013-06-10 NOTE — Telephone Encounter (Signed)
Please schedule f/u and refill until then  

## 2013-06-11 ENCOUNTER — Telehealth: Payer: Self-pay | Admitting: Family Medicine

## 2013-06-11 MED ORDER — HYDROCHLOROTHIAZIDE 50 MG PO TABS: 50.0000 mg | ORAL_TABLET | Freq: Every day | ORAL | Status: DC

## 2013-06-11 MED ORDER — ENALAPRIL MALEATE 10 MG PO TABS: 10.0000 mg | ORAL_TABLET | Freq: Every day | ORAL | Status: DC

## 2013-06-11 NOTE — Telephone Encounter (Signed)
Pt is out of B/P meds and is needing some refills until his visit next Friday the 27th. Please advise. Pt uses [facility] in [place]

## 2013-06-11 NOTE — Telephone Encounter (Signed)
meds refilled and pt  notified .

## 2013-06-20 ENCOUNTER — Encounter: Payer: Self-pay | Admitting: Family Medicine

## 2013-06-20 ENCOUNTER — Ambulatory Visit (INDEPENDENT_AMBULATORY_CARE_PROVIDER_SITE_OTHER): Payer: BC Managed Care – HMO | Admitting: Family Medicine

## 2013-06-20 VITALS — BP 130/82 | HR 79 | Temp 98.1°F | Ht 72.0 in | Wt 207.5 lb

## 2013-06-20 DIAGNOSIS — E785 Hyperlipidemia, unspecified: Secondary | ICD-10-CM

## 2013-06-20 DIAGNOSIS — I10 Essential (primary) hypertension: Secondary | ICD-10-CM

## 2013-06-20 LAB — CBC WITH DIFFERENTIAL/PLATELET
BASOS ABS: 0 10*3/uL (ref 0.0–0.1)
Basophils Relative: 0.5 % (ref 0.0–3.0)
EOS PCT: 2.3 % (ref 0.0–5.0)
Eosinophils Absolute: 0.1 10*3/uL (ref 0.0–0.7)
HCT: 46.3 % (ref 39.0–52.0)
Hemoglobin: 15.6 g/dL (ref 13.0–17.0)
LYMPHS PCT: 22.7 % (ref 12.0–46.0)
Lymphs Abs: 0.8 10*3/uL (ref 0.7–4.0)
MCHC: 33.7 g/dL (ref 30.0–36.0)
MCV: 89.1 fl (ref 78.0–100.0)
MONOS PCT: 9.4 % (ref 3.0–12.0)
Monocytes Absolute: 0.4 10*3/uL (ref 0.1–1.0)
NEUTROS PCT: 65.1 % (ref 43.0–77.0)
Neutro Abs: 2.4 10*3/uL (ref 1.4–7.7)
PLATELETS: 190 10*3/uL (ref 150.0–400.0)
RBC: 5.2 Mil/uL (ref 4.22–5.81)
RDW: 12.6 % (ref 11.5–14.6)
WBC: 3.7 10*3/uL — AB (ref 4.5–10.5)

## 2013-06-20 LAB — COMPREHENSIVE METABOLIC PANEL
ALBUMIN: 4.2 g/dL (ref 3.5–5.2)
ALT: 32 U/L (ref 0–53)
AST: 23 U/L (ref 0–37)
Alkaline Phosphatase: 63 U/L (ref 39–117)
BILIRUBIN TOTAL: 0.6 mg/dL (ref 0.3–1.2)
BUN: 15 mg/dL (ref 6–23)
CALCIUM: 9.2 mg/dL (ref 8.4–10.5)
CHLORIDE: 104 meq/L (ref 96–112)
CO2: 28 meq/L (ref 19–32)
Creatinine, Ser: 1 mg/dL (ref 0.4–1.5)
GFR: 84.99 mL/min (ref >60.00)
Glucose, Bld: 106 mg/dL — ABNORMAL HIGH (ref 70–99)
POTASSIUM: 4.1 meq/L (ref 3.5–5.1)
SODIUM: 138 meq/L (ref 135–145)
TOTAL PROTEIN: 6.7 g/dL (ref 6.0–8.3)

## 2013-06-20 LAB — TSH: TSH: 2.03 u[IU]/mL (ref 0.35–5.50)

## 2013-06-20 LAB — LIPID PANEL
CHOL/HDL RATIO: 5
Cholesterol: 192 mg/dL (ref 0–200)
HDL: 36 mg/dL — AB (ref >39.00)
LDL Cholesterol: 135 mg/dL — ABNORMAL HIGH (ref 0–99)
Triglycerides: 106 mg/dL (ref 0.0–149.0)
VLDL: 21.2 mg/dL (ref 0.0–40.0)

## 2013-06-20 MED ORDER — ENALAPRIL MALEATE 10 MG PO TABS: 10.0000 mg | ORAL_TABLET | Freq: Every day | ORAL | Status: DC

## 2013-06-20 MED ORDER — HYDROCHLOROTHIAZIDE 25 MG PO TABS: 25.0000 mg | ORAL_TABLET | Freq: Every day | ORAL | Status: DC

## 2013-06-20 NOTE — Progress Notes (Signed)
Pre visit review using our clinic review tool, if applicable. No additional management support is needed unless otherwise documented below in the visit note. 

## 2013-06-20 NOTE — Patient Instructions (Signed)
Labs today Take good care of yourself  I sent medicines to your pharmacy

## 2013-06-20 NOTE — Progress Notes (Signed)
Subjective:    Patient ID: Terry Lowe, male    DOB: 02/16/1956, 58 y.o.   MRN: [id number]  HPI Here for f/u of chronic health problems   Doing ok  Nothing new medically  No new complaints   bp is stable today  No cp or palpitations or headaches or edema  No side effects to medicines  Has noticed his bp gets too low if he takes 3-4 days in a row  BP Readings from Last 3 Encounters:  06/20/13 130/82  04/28/11 118/72  09/28/09 130/80     Hx of CAD- doing well and no problems   Taking care of himself - really tries to eat healthy and gets regular exercise   Cancer follow ups are doing fine  Oncology signed off on him  Can finally taste again - happy about that  Not a lot of neck spasms - had to use muscle relaxers for that -seldom now   Hx of hyperlipidemia Lab Results  Component Value Date   CHOL 205* 09/28/2009   HDL 49.30 09/28/2009   LDLCALC 112* 06/03/2007   LDLDIRECT 133.7 09/28/2009   TRIG 154.0* 09/28/2009   CHOLHDL 4 09/28/2009    Due for medicine refill   Just had his urologist visit- bph and had visit for refill of uroxatrol  Exam was ok    Patient Active Problem List   Diagnosis Date Noted  . CARCINOMA, SQUAMOUS CELL 09/28/2009  . HYPERLIPIDEMIA 06/03/2007  . 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 TESTOSTERONE DEFICIENCY 06/25/2006  . HYPERTENSION 06/25/2006  . CAD 06/25/2006   Past Medical History  Diagnosis Date  . HTN (hypertension)   . CAD (coronary artery disease)   . Varicose vein     L lower leg- pain  . BPH (benign prostatic hyperplasia)   . Squamous cell carcinoma     neck mass- surgery- radical neck dissection/radiation and chemo  . Low testosterone     with replacement  . Hyperlipidemia    Past Surgical History  Procedure Laterality Date  . Tsa  1960s  . Cath (other)  2007    hospital for chest pain  . Ct of neck  9/10    enlarged lymph node  . Ct of chest  9/10    atelectasis  . Ln r neck  9/10    inconclusive fine needle bx  . Squamous  cell carcinoma/metastatic      foundin neck 10/10 radical neck dissection   History  Substance Use Topics  . Smoking status: Former Research scientist (life sciences)  . Smokeless tobacco: Not on file  . Alcohol Use: Yes     Comment: 1-2 beers per day occas   Family History  Problem Relation Age of Onset  . Coronary artery disease Father   . Melanoma Father   . Heart disease Father     CAD  . Cancer Father     melanoma   Allergies  Allergen Reactions  . Pneumococcal Vaccine Polyvalent     REACTION: itching for over a year  . Statins     REACTION: myalgia severe   Current Outpatient Prescriptions on File Prior to Visit  Medication Sig Dispense Refill  . alfuzosin (UROXATRAL) 10 MG 24 hr tablet Take 10 mg by mouth daily.        Marland Kitchen aspirin (BAYER ASPIRIN EC LOW DOSE) 81 MG EC tablet Take 81 mg by mouth daily.        . enalapril (VASOTEC) 10 MG tablet Take 1 tablet (10  mg total) by mouth daily.  30 tablet  0  . hydrochlorothiazide (HYDRODIURIL) 50 MG tablet Take 1 tablet (50 mg total) by mouth daily.  30 tablet  0  . Omega-3 Fatty Acids (FISH OIL) 1000 MG CAPS Take by mouth. Take 4       . vitamin C (ASCORBIC ACID) 500 MG tablet Take 500 mg by mouth daily.       No current facility-administered medications on file prior to visit.    Review of Systems    Review of Systems  Constitutional: Negative for fever, appetite change, fatigue and unexpected weight change.  Eyes: Negative for pain and visual disturbance.  Respiratory: Negative for cough and shortness of breath.   Cardiovascular: Negative for cp or palpitations  Pos for low bp after 3-4 d of med at full dose  Gastrointestinal: Negative for nausea, diarrhea and constipation.  Genitourinary: Negative for urgency and frequency.  Skin: Negative for pallor or rash   Neurological: Negative for weakness, light-headedness, numbness and headaches.  Hematological: Negative for adenopathy. Does not bruise/bleed easily.  Psychiatric/Behavioral: Negative for  dysphoric mood. The patient is not nervous/anxious.      Objective:   Physical Exam  Constitutional: He appears well-developed and well-nourished. No distress.  HENT:  Head: Normocephalic and atraumatic.  Right Ear: External ear normal.  Left Ear: External ear normal.  Nose: Nose normal.  Mouth/Throat: Oropharynx is clear and moist.  Surgical changes noted to his neck  Eyes: Conjunctivae and EOM are normal. Pupils are equal, round, and reactive to light. Right eye exhibits no discharge. Left eye exhibits no discharge. No scleral icterus.  Neck: Normal range of motion. Neck supple. No JVD present. Carotid bruit is not present. No thyromegaly present.  Cardiovascular: Normal rate, regular rhythm, normal heart sounds and intact distal pulses.  Exam reveals no gallop.   Pulmonary/Chest: Effort normal and breath sounds normal. No respiratory distress. He has no wheezes. He exhibits no tenderness.  Abdominal: Soft. Bowel sounds are normal. He exhibits no distension, no abdominal bruit and no mass. There is no tenderness.  Musculoskeletal: He exhibits no edema and no tenderness.  Lymphadenopathy:    He has no cervical adenopathy.  Neurological: He is alert. He has normal reflexes. No cranial nerve deficit. He exhibits normal muscle tone. Coordination normal.  Skin: Skin is warm and dry. No rash noted. No erythema. No pallor.  Psychiatric: He has a normal mood and affect.          Assessment & Plan:

## 2013-06-21 ENCOUNTER — Telehealth: Payer: Self-pay | Admitting: Family Medicine

## 2013-06-21 NOTE — Telephone Encounter (Signed)
Relevant patient education mailed to patient.  

## 2013-06-22 NOTE — Assessment & Plan Note (Signed)
Due to low bp-will cut hctz from 50 to 25 mg and update bp in fair control at this time  BP Readings from Last 1 Encounters:  06/20/13 130/82   No changes needed Disc lifstyle change with low sodium diet and exercise   Lab today

## 2013-06-22 NOTE — Assessment & Plan Note (Signed)
Lipid panel today Rev low sat fat diet  Pt understands imp of this in setting of CAD

## 2013-07-10 ENCOUNTER — Encounter: Payer: Self-pay | Admitting: *Deleted

## 2013-08-01 ENCOUNTER — Ambulatory Visit: Payer: Self-pay | Admitting: Oncology

## 2013-08-25 ENCOUNTER — Ambulatory Visit: Payer: Self-pay | Admitting: Oncology

## 2014-02-02 ENCOUNTER — Ambulatory Visit: Payer: Self-pay | Admitting: Oncology

## 2014-02-02 LAB — CBC CANCER CENTER
BASOS ABS: 0 x10 3/mm (ref 0.0–0.1)
BASOS PCT: 0.6 %
EOS ABS: 0.1 x10 3/mm (ref 0.0–0.7)
Eosinophil %: 1.9 %
HCT: 50.2 % (ref 40.0–52.0)
HGB: 16.7 g/dL (ref 13.0–18.0)
LYMPHS ABS: 0.9 x10 3/mm — AB (ref 1.0–3.6)
Lymphocyte %: 25 %
MCH: 30 pg (ref 26.0–34.0)
MCHC: 33.3 g/dL (ref 32.0–36.0)
MCV: 90 fL (ref 80–100)
MONO ABS: 0.4 x10 3/mm (ref 0.2–1.0)
MONOS PCT: 9.6 %
NEUTROS PCT: 62.9 %
Neutrophil #: 2.4 x10 3/mm (ref 1.4–6.5)
Platelet: 183 x10 3/mm (ref 150–440)
RBC: 5.57 10*6/uL (ref 4.40–5.90)
RDW: 12.8 % (ref 11.5–14.5)
WBC: 3.8 x10 3/mm (ref 3.8–10.6)

## 2014-02-02 LAB — COMPREHENSIVE METABOLIC PANEL
AST: 30 U/L (ref 15–37)
Albumin: 4.3 g/dL (ref 3.4–5.0)
Alkaline Phosphatase: 86 U/L
Anion Gap: 7 (ref 7–16)
BUN: 20 mg/dL — AB (ref 7–18)
Bilirubin,Total: 0.6 mg/dL (ref 0.2–1.0)
CALCIUM: 9 mg/dL (ref 8.5–10.1)
CHLORIDE: 102 mmol/L (ref 98–107)
Co2: 31 mmol/L (ref 21–32)
Creatinine: 1.09 mg/dL (ref 0.60–1.30)
EGFR (African American): >60
EGFR (Non-African Amer.): >60
Glucose: 110 mg/dL — ABNORMAL HIGH (ref 65–99)
Osmolality: 283 (ref 275–301)
POTASSIUM: 3.8 mmol/L (ref 3.5–5.1)
SGPT (ALT): 56 U/L
SODIUM: 140 mmol/L (ref 136–145)
Total Protein: 7.4 g/dL (ref 6.4–8.2)

## 2014-02-02 LAB — TSH: Thyroid Stimulating Horm: 2.91 u[IU]/mL

## 2014-02-24 ENCOUNTER — Ambulatory Visit: Payer: Self-pay | Admitting: Oncology

## 2014-07-07 ENCOUNTER — Other Ambulatory Visit: Payer: Self-pay

## 2014-07-07 MED ORDER — ENALAPRIL MALEATE 10 MG PO TABS: 10.0000 mg | ORAL_TABLET | Freq: Every day | ORAL | Status: DC

## 2014-07-07 MED ORDER — HYDROCHLOROTHIAZIDE 25 MG PO TABS: 25.0000 mg | ORAL_TABLET | Freq: Every day | ORAL | Status: DC

## 2014-08-14 ENCOUNTER — Other Ambulatory Visit: Payer: Self-pay

## 2014-08-14 ENCOUNTER — Ambulatory Visit: Payer: Self-pay | Admitting: Family Medicine

## 2014-08-14 MED ORDER — ENALAPRIL MALEATE 10 MG PO TABS: 10.0000 mg | ORAL_TABLET | Freq: Every day | ORAL | Status: DC

## 2014-08-14 MED ORDER — HYDROCHLOROTHIAZIDE 25 MG PO TABS: 25.0000 mg | ORAL_TABLET | Freq: Every day | ORAL | Status: DC

## 2014-08-14 NOTE — Telephone Encounter (Signed)
Pt left v/m requesting refill enalapril and HCTZ to [facility]; pt has appt scheduled to see Dr Glori Bickers on 08/21/14. Refill done. Unable to reach pt by phone and on v/m pt did not request cb.

## 2014-08-21 ENCOUNTER — Ambulatory Visit (INDEPENDENT_AMBULATORY_CARE_PROVIDER_SITE_OTHER): Payer: BLUE CROSS/BLUE SHIELD | Admitting: Family Medicine

## 2014-08-21 ENCOUNTER — Encounter: Payer: Self-pay | Admitting: Family Medicine

## 2014-08-21 VITALS — BP 132/88 | HR 82 | Temp 98.1°F | Ht 72.0 in | Wt 195.0 lb

## 2014-08-21 DIAGNOSIS — I10 Essential (primary) hypertension: Secondary | ICD-10-CM | POA: Diagnosis not present

## 2014-08-21 DIAGNOSIS — J302 Other seasonal allergic rhinitis: Secondary | ICD-10-CM | POA: Diagnosis not present

## 2014-08-21 DIAGNOSIS — J309 Allergic rhinitis, unspecified: Secondary | ICD-10-CM | POA: Insufficient documentation

## 2014-08-21 MED ORDER — HYDROCHLOROTHIAZIDE 25 MG PO TABS: 25.0000 mg | ORAL_TABLET | Freq: Every day | ORAL | Status: DC

## 2014-08-21 MED ORDER — ENALAPRIL MALEATE 10 MG PO TABS: 10.0000 mg | ORAL_TABLET | Freq: Every day | ORAL | Status: DC

## 2014-08-21 MED ORDER — AZELASTINE-FLUTICASONE 137-50 MCG/ACT NA SUSP: 1.0000 | Freq: Every day | NASAL | Status: DC

## 2014-08-21 NOTE — Progress Notes (Signed)
Pre visit review using our clinic review tool, if applicable. No additional management support is needed unless otherwise documented below in the visit note. 

## 2014-08-21 NOTE — Assessment & Plan Note (Signed)
Disc goals for lipids and reasons to control them Rev labs with pt from last draw  Rev low sat fat diet in detail

## 2014-08-21 NOTE — Progress Notes (Signed)
Subjective:    Patient ID: Terry Lowe, male    DOB: 08/12/1955, 59 y.o.   MRN: [id number]  HPI Here for f/u of chronic health problems   Wt is down 12 lb today with bmi of 26 Watching what he eats now Happy he is doing it  He is avoiding fast foods for the most part- but occ [employer]   Very physical job   bp is up today  BP Readings from Last 3 Encounters:  08/21/14 150/90  06/20/13 130/82  04/28/11 118/72     Usually lower at home 371I systolic /96V  Feels better than he used to  Takes his enalapril and hctz at night  No headaches or cp or other symptoms   Having allergy issues Has dymista and that helped - Dr Virgia Land   Patient Active Problem List   Diagnosis Date Noted  . CARCINOMA, SQUAMOUS CELL 09/28/2009  . Hyperlipidemia 06/03/2007  . 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 TESTOSTERONE DEFICIENCY 06/25/2006  . Essential hypertension 06/25/2006  . CAD 06/25/2006   Past Medical History  Diagnosis Date  . HTN (hypertension)   . CAD (coronary artery disease)   . Varicose vein     L lower leg- pain  . BPH (benign prostatic hyperplasia)   . Squamous cell carcinoma     neck mass- surgery- radical neck dissection/radiation and chemo  . Low testosterone     with replacement  . Hyperlipidemia    Past Surgical History  Procedure Laterality Date  . Tsa  1960s  . Cath (other)  2007    hospital for chest pain  . Ct of neck  9/10    enlarged lymph node  . Ct of chest  9/10    atelectasis  . Ln r neck  9/10    inconclusive fine needle bx  . Squamous cell carcinoma/metastatic      foundin neck 10/10 radical neck dissection   History  Substance Use Topics  . Smoking status: Former Research scientist (life sciences)  . Smokeless tobacco: Not on file  . Alcohol Use: 0.0 oz/week    0 Standard drinks or equivalent per week     Comment: 1-2 beers per day occas   Family History  Problem Relation Age of Onset  . Coronary artery disease Father   . Melanoma Father   . Heart disease Father     CAD    . Cancer Father     melanoma   Allergies  Allergen Reactions  . Pneumococcal Vaccine Polyvalent     REACTION: itching for over a year  . Statins     REACTION: myalgia severe   Current Outpatient Prescriptions on File Prior to Visit  Medication Sig Dispense Refill  . alfuzosin (UROXATRAL) 10 MG 24 hr tablet Take 10 mg by mouth daily.      Marland Kitchen aspirin (BAYER ASPIRIN EC LOW DOSE) 81 MG EC tablet Take 81 mg by mouth daily.      . enalapril (VASOTEC) 10 MG tablet Take 1 tablet (10 mg total) by mouth daily. 30 tablet 0  . hydrochlorothiazide (HYDRODIURIL) 25 MG tablet Take 1 tablet (25 mg total) by mouth daily. 30 tablet 0  . Omega-3 Fatty Acids (FISH OIL) 1000 MG CAPS Take by mouth. Take 4     . vitamin C (ASCORBIC ACID) 500 MG tablet Take 500 mg by mouth daily.     No current facility-administered medications on file prior to visit.      Review of Systems Review of  Systems  Constitutional: Negative for fever, appetite change, fatigue and unexpected weight change.  Eyes: Negative for pain and visual disturbance.  Respiratory: Negative for cough and shortness of breath.   Cardiovascular: Negative for cp or palpitations    Gastrointestinal: Negative for nausea, diarrhea and constipation.  Genitourinary: Negative for urgency and frequency.  Skin: Negative for pallor or rash   Neurological: Negative for weakness, light-headedness, numbness and headaches.  Hematological: Negative for adenopathy. Does not bruise/bleed easily.  Psychiatric/Behavioral: Negative for dysphoric mood. The patient is not nervous/anxious.         Objective:   Physical Exam  Constitutional: He appears well-developed and well-nourished. No distress.  HENT:  Head: Normocephalic and atraumatic.  Mouth/Throat: Oropharynx is clear and moist.  Boggy nares   Eyes: Conjunctivae and EOM are normal. Pupils are equal, round, and reactive to light.  Neck: Normal range of motion. Neck supple. No JVD present. Carotid  bruit is not present. No thyromegaly present.  Cardiovascular: Normal rate, regular rhythm, normal heart sounds and intact distal pulses.  Exam reveals no gallop.   Pulmonary/Chest: Effort normal and breath sounds normal. No respiratory distress. He has no wheezes. He has no rales.  No crackles  Abdominal: He exhibits no abdominal bruit.  Musculoskeletal: He exhibits no edema.  Lymphadenopathy:    He has no cervical adenopathy.  Neurological: He is alert. He has normal reflexes.  Skin: Skin is warm and dry. No rash noted.  Tanned with solar lentigo  Psychiatric: He has a normal mood and affect.          Assessment & Plan:   Problem List Items Addressed This Visit    Allergic rhinitis    In pt with seasonal symptoms who is outdoors a lot Tried dymista from Dr Richardson Landry- and needs a refill -works well  Sent this to the pharmacy  Did rev allergen avoidance       Essential hypertension - Primary    bp in fair control at this time  BP Readings from Last 1 Encounters:  08/21/14 132/88   No changes needed Disc lifstyle change with low sodium diet and exercise  Commended great lifestyle change with wt loss and lower sodium diet and exercise  Refilled medicines       Relevant Medications   enalapril (VASOTEC) 10 MG tablet   hydrochlorothiazide (HYDRODIURIL) 25 MG tablet   Hyperlipidemia    Disc goals for lipids and reasons to control them Rev labs with pt from last draw  Rev low sat fat diet in detail       Relevant Medications   enalapril (VASOTEC) 10 MG tablet   hydrochlorothiazide (HYDRODIURIL) 25 MG tablet

## 2014-08-21 NOTE — Assessment & Plan Note (Signed)
bp in fair control at this time  BP Readings from Last 1 Encounters:  08/21/14 132/88   No changes needed Disc lifstyle change with low sodium diet and exercise  Commended great lifestyle change with wt loss and lower sodium diet and exercise  Refilled medicines

## 2014-08-21 NOTE — Assessment & Plan Note (Signed)
In pt with seasonal symptoms who is outdoors a lot Tried dymista from Dr Richardson Landry- and needs a refill -works well  Sent this to the pharmacy  Did rev allergen avoidance

## 2014-08-21 NOTE — Patient Instructions (Signed)
Blood pressure is stable  Great job with weight loss Keep up the good work!  Follow up in a year for PE

## 2015-01-04 ENCOUNTER — Other Ambulatory Visit: Payer: Self-pay

## 2015-01-11 ENCOUNTER — Ambulatory Visit: Payer: Self-pay | Admitting: Obstetrics and Gynecology

## 2015-02-01 ENCOUNTER — Other Ambulatory Visit: Payer: BLUE CROSS/BLUE SHIELD

## 2015-02-01 ENCOUNTER — Other Ambulatory Visit: Payer: Self-pay | Admitting: *Deleted

## 2015-02-01 DIAGNOSIS — N400 Enlarged prostate without lower urinary tract symptoms: Secondary | ICD-10-CM

## 2015-02-01 DIAGNOSIS — C760 Malignant neoplasm of head, face and neck: Secondary | ICD-10-CM

## 2015-02-01 DIAGNOSIS — E291 Testicular hypofunction: Secondary | ICD-10-CM

## 2015-02-02 LAB — TESTOSTERONE: Testosterone: 199 ng/dL — ABNORMAL LOW (ref 348–1197)

## 2015-02-02 LAB — HEMATOCRIT: Hematocrit: 49.1 % (ref 37.5–51.0)

## 2015-02-02 LAB — PSA: PROSTATE SPECIFIC AG, SERUM: 1.7 ng/mL (ref 0.0–4.0)

## 2015-02-03 ENCOUNTER — Encounter: Payer: Self-pay | Admitting: *Deleted

## 2015-02-05 ENCOUNTER — Encounter: Payer: Self-pay | Admitting: *Deleted

## 2015-02-08 ENCOUNTER — Telehealth: Payer: Self-pay | Admitting: Radiology

## 2015-02-08 ENCOUNTER — Encounter: Payer: Self-pay | Admitting: Oncology

## 2015-02-08 ENCOUNTER — Ambulatory Visit (INDEPENDENT_AMBULATORY_CARE_PROVIDER_SITE_OTHER): Payer: BLUE CROSS/BLUE SHIELD | Admitting: Urology

## 2015-02-08 ENCOUNTER — Encounter: Payer: Self-pay | Admitting: Urology

## 2015-02-08 ENCOUNTER — Inpatient Hospital Stay: Payer: BLUE CROSS/BLUE SHIELD | Attending: Oncology

## 2015-02-08 ENCOUNTER — Inpatient Hospital Stay (HOSPITAL_BASED_OUTPATIENT_CLINIC_OR_DEPARTMENT_OTHER): Payer: BLUE CROSS/BLUE SHIELD | Admitting: Oncology

## 2015-02-08 VITALS — BP 155/91 | HR 85 | Temp 95.3°F | Wt 199.1 lb

## 2015-02-08 VITALS — BP 157/96 | HR 98 | Ht 72.0 in | Wt 199.5 lb

## 2015-02-08 DIAGNOSIS — Z7982 Long term (current) use of aspirin: Secondary | ICD-10-CM | POA: Diagnosis not present

## 2015-02-08 DIAGNOSIS — C770 Secondary and unspecified malignant neoplasm of lymph nodes of head, face and neck: Secondary | ICD-10-CM | POA: Diagnosis not present

## 2015-02-08 DIAGNOSIS — N400 Enlarged prostate without lower urinary tract symptoms: Secondary | ICD-10-CM | POA: Insufficient documentation

## 2015-02-08 DIAGNOSIS — N138 Other obstructive and reflux uropathy: Secondary | ICD-10-CM

## 2015-02-08 DIAGNOSIS — Z9221 Personal history of antineoplastic chemotherapy: Secondary | ICD-10-CM

## 2015-02-08 DIAGNOSIS — R1900 Intra-abdominal and pelvic swelling, mass and lump, unspecified site: Secondary | ICD-10-CM

## 2015-02-08 DIAGNOSIS — Z79899 Other long term (current) drug therapy: Secondary | ICD-10-CM | POA: Diagnosis not present

## 2015-02-08 DIAGNOSIS — N529 Male erectile dysfunction, unspecified: Secondary | ICD-10-CM

## 2015-02-08 DIAGNOSIS — Z923 Personal history of irradiation: Secondary | ICD-10-CM | POA: Diagnosis not present

## 2015-02-08 DIAGNOSIS — Z87891 Personal history of nicotine dependence: Secondary | ICD-10-CM

## 2015-02-08 DIAGNOSIS — C760 Malignant neoplasm of head, face and neck: Secondary | ICD-10-CM

## 2015-02-08 DIAGNOSIS — N528 Other male erectile dysfunction: Secondary | ICD-10-CM | POA: Diagnosis not present

## 2015-02-08 DIAGNOSIS — I2510 Atherosclerotic heart disease of native coronary artery without angina pectoris: Secondary | ICD-10-CM | POA: Diagnosis not present

## 2015-02-08 DIAGNOSIS — C801 Malignant (primary) neoplasm, unspecified: Secondary | ICD-10-CM | POA: Insufficient documentation

## 2015-02-08 DIAGNOSIS — E785 Hyperlipidemia, unspecified: Secondary | ICD-10-CM | POA: Diagnosis not present

## 2015-02-08 DIAGNOSIS — E291 Testicular hypofunction: Secondary | ICD-10-CM | POA: Insufficient documentation

## 2015-02-08 DIAGNOSIS — N401 Enlarged prostate with lower urinary tract symptoms: Secondary | ICD-10-CM | POA: Diagnosis not present

## 2015-02-08 DIAGNOSIS — I10 Essential (primary) hypertension: Secondary | ICD-10-CM | POA: Diagnosis not present

## 2015-02-08 LAB — COMPREHENSIVE METABOLIC PANEL
ALBUMIN: 4.6 g/dL (ref 3.5–5.0)
ALK PHOS: 72 U/L (ref 38–126)
ALT: 39 U/L (ref 17–63)
ANION GAP: 8 (ref 5–15)
AST: 29 U/L (ref 15–41)
BILIRUBIN TOTAL: 0.8 mg/dL (ref 0.3–1.2)
BUN: 16 mg/dL (ref 6–20)
CALCIUM: 9.4 mg/dL (ref 8.9–10.3)
CO2: 29 mmol/L (ref 22–32)
CREATININE: 1.03 mg/dL (ref 0.61–1.24)
Chloride: 99 mmol/L — ABNORMAL LOW (ref 101–111)
GFR calc Af Amer: >60 mL/min (ref >60)
GFR calc non Af Amer: >60 mL/min (ref >60)
GLUCOSE: 116 mg/dL — AB (ref 65–99)
Potassium: 4.2 mmol/L (ref 3.5–5.1)
Sodium: 136 mmol/L (ref 135–145)
TOTAL PROTEIN: 7.5 g/dL (ref 6.5–8.1)

## 2015-02-08 LAB — CBC WITH DIFFERENTIAL/PLATELET
BASOS PCT: 1 %
Basophils Absolute: 0 10*3/uL (ref 0–0.1)
Eosinophils Absolute: 0.1 10*3/uL (ref 0–0.7)
Eosinophils Relative: 2 %
HEMATOCRIT: 50.3 % (ref 40.0–52.0)
HEMOGLOBIN: 16.9 g/dL (ref 13.0–18.0)
LYMPHS ABS: 0.9 10*3/uL — AB (ref 1.0–3.6)
Lymphocytes Relative: 23 %
MCH: 29.5 pg (ref 26.0–34.0)
MCHC: 33.6 g/dL (ref 32.0–36.0)
MCV: 87.7 fL (ref 80.0–100.0)
MONOS PCT: 9 %
Monocytes Absolute: 0.4 10*3/uL (ref 0.2–1.0)
NEUTROS ABS: 2.5 10*3/uL (ref 1.4–6.5)
NEUTROS PCT: 65 %
Platelets: 168 10*3/uL (ref 150–440)
RBC: 5.73 MIL/uL (ref 4.40–5.90)
RDW: 12.6 % (ref 11.5–14.5)
WBC: 3.8 10*3/uL (ref 3.8–10.6)

## 2015-02-08 LAB — TSH: TSH: 3.794 u[IU]/mL (ref 0.350–4.500)

## 2015-02-08 MED ORDER — ALFUZOSIN HCL ER 10 MG PO TB24: 10.0000 mg | ORAL_TABLET | Freq: Every day | ORAL | Status: DC

## 2015-02-08 MED ORDER — CLOMIPHENE CITRATE 50 MG PO TABS: ORAL_TABLET | ORAL | Status: DC

## 2015-02-08 MED ORDER — SILDENAFIL CITRATE 100 MG PO TABS: 100.0000 mg | ORAL_TABLET | Freq: Every day | ORAL | Status: DC | PRN

## 2015-02-08 NOTE — Progress Notes (Signed)
Patient here for one annual follow up.

## 2015-02-08 NOTE — Progress Notes (Signed)
[place] @ [facility] [phone]  Fax:([phone]) [place]: Apr 29, 1955  MR#: [id number]  PTW#:[id number]  Patient Care Team: Abner Greenspan, MD as PCP - General  CHIEF COMPLAINT:  Chief Complaint  Patient presents with  . OTHER   59 year old male, with squamous cell carcinoma, presenting in a left cervical lymph node . positive for squamous cell carcinoma.  Underwent modified, radical neck dissection.  Had 1/32   positive lymph node .Marland Kitchen  Patient has been seen by radiation oncologist  No history exists.    No flowsheet data found.  INTERVAL HISTORY: Patient is here for further evaluation and treatment consideration.  Abdominal wall nodule has not increased in size.  No difficulty swallowing.  REVIEW OF SYSTEMS:   GENERAL:  Feels good.  Active.  No fevers, sweats or weight loss. PERFORMANCE STATUS (ECOG):  0 HEENT:  No visual changes, runny nose, sore throat, mouth sores or tenderness. Lungs: No shortness of breath or cough.  No hemoptysis. Cardiac:  No chest pain, palpitations, orthopnea, or PND. GI:  No nausea, vomiting, diarrhea, constipation, melena or hematochezia. GU:  No urgency, frequency, dysuria, or hematuria. Musculoskeletal:  No back pain.  No joint pain.  No muscle tenderness. Extremities:  No pain or swelling. Skin:  No rashes or skin changes. Neuro:  No headache, numbness or weakness, balance or coordination issues. Endocrine:  No diabetes, thyroid issues, hot flashes or night sweats. Psych:  No mood changes, depression or anxiety. Pain:  No focal pain. Review of systems:  All other systems reviewed and found to be negative. As per HPI. Otherwise, a complete review of systems is negatve.  PAST MEDICAL HISTORY: Past Medical History  Diagnosis Date  . HTN (hypertension)   . CAD (coronary artery disease)   . Varicose vein     L lower leg- pain  . BPH (benign prostatic hyperplasia)   . Squamous cell carcinoma (HCC)     neck mass-  surgery- radical neck dissection/radiation and chemo  . Low testosterone     with replacement  . Hyperlipidemia   . Erectile dysfunction   . Hypogonadism in male     PAST SURGICAL HISTORY: Past Surgical History  Procedure Laterality Date  . Tsa  1960s  . Cath (other)  2007    hospital for chest pain  . Ct of neck  9/10    enlarged lymph node  . Ct of chest  9/10    atelectasis  . Ln r neck  9/10    inconclusive fine needle bx  . Squamous cell carcinoma/metastatic      foundin neck 10/10 radical neck dissection    FAMILY HISTORY Family History  Problem Relation Age of Onset  . Coronary artery disease Father   . Melanoma Father   . Heart disease Father     CAD  . Cancer Father     melanoma  . Breast cancer Mother     ADVANCED DIRECTIVES:  No flowsheet data found.  HEALTH MAINTENANCE: Social History  Substance Use Topics  . Smoking status: Former Research scientist (life sciences)  . Smokeless tobacco: None     Comment: quit 20 years ago  . Alcohol Use: 0.0 oz/week    0 Standard drinks or equivalent per week     Comment: 1-2 beers per day occas      Allergies  Allergen Reactions  . Pneumococcal Vaccine Polyvalent     REACTION: itching for over a year  . Statins  REACTION: myalgia severe    Current Outpatient Prescriptions  Medication Sig Dispense Refill  . alfuzosin (UROXATRAL) 10 MG 24 hr tablet Take 10 mg by mouth daily.      . ANDROGEL PUMP 20.25 MG/ACT (1.62%) GEL 2 pumps daily  0  . aspirin (BAYER ASPIRIN EC LOW DOSE) 81 MG EC tablet Take 81 mg by mouth daily.      . Azelastine-Fluticasone 137-50 MCG/ACT SUSP Place 1 spray into the nose daily. 23 g 11  . enalapril (VASOTEC) 10 MG tablet Take 1 tablet (10 mg total) by mouth daily. 30 tablet 11  . hydrochlorothiazide (HYDRODIURIL) 25 MG tablet Take 1 tablet (25 mg total) by mouth daily. 30 tablet 11  . Omega-3 Fatty Acids (FISH OIL) 1000 MG CAPS Take by mouth. Take 4     . vitamin C (ASCORBIC ACID) 500 MG tablet Take 500 mg  by mouth daily.     No current facility-administered medications for this visit.    OBJECTIVE:  Filed Vitals:   02/08/15 1225  BP: 155/91  Pulse: 85  Temp: 95.3 F (35.2 C)     Body mass index is 26.99 kg/(m^2).    ECOG FS:0 - Asymptomatic  PHYSICAL EXAM: GENERAL:  Well developed, well nourished, sitting comfortably in the exam room in no acute distress. MENTAL STATUS:  Alert and oriented to person, place and time.  ENT:  Oropharynx clear without lesion.  Tongue normal. Mucous membranes moist.   RESPIRATORY:  Clear to auscultation without rales, wheezes or rhonchi. CARDIOVASCULAR:  Regular rate and rhythm without murmur, rub or gallop. BREAST:  Right breast without masses, skin changes or nipple discharge.  Left breast without masses, skin changes or nipple discharge. ABDOMEN:  Soft, non-tender, with active bowel sounds, and no hepatosplenomegaly.  No masses. BACK:  No CVA tenderness.  No tenderness on percussion of the back or rib cage. SKIN:  No rashes, ulcers or lesions. EXTREMITIES: No edema, no skin discoloration or tenderness.  No palpable cords. LYMPH NODES: No palpable cervical, supraclavicular, axillary or inguinal adenopathy  NEUROLOGICAL: Unremarkable. PSYCH:  Appropriate.   LAB RESULTS:  CBC Latest Ref Rng 02/08/2015 02/01/2015  WBC 3.8 - 10.6 K/uL 3.8 -  Hemoglobin 13.0 - 18.0 g/dL 16.9 -  Hematocrit 40.0 - 52.0 % 50.3 49.1  Platelets 150 - 440 K/uL 168 -    Appointment on 02/08/2015  Component Date Value Ref Range Status  . WBC 02/08/2015 3.8  3.8 - 10.6 K/uL Final  . RBC 02/08/2015 5.73  4.40 - 5.90 MIL/uL Final  . Hemoglobin 02/08/2015 16.9  13.0 - 18.0 g/dL Final  . HCT 02/08/2015 50.3  40.0 - 52.0 % Final  . MCV 02/08/2015 87.7  80.0 - 100.0 fL Final  . MCH 02/08/2015 29.5  26.0 - 34.0 pg Final  . MCHC 02/08/2015 33.6  32.0 - 36.0 g/dL Final  . RDW 02/08/2015 12.6  11.5 - 14.5 % Final  . Platelets 02/08/2015 168  150 - 440 K/uL Final  .  Neutrophils Relative % 02/08/2015 65   Final  . Neutro Abs 02/08/2015 2.5  1.4 - 6.5 K/uL Final  . Lymphocytes Relative 02/08/2015 23   Final  . Lymphs Abs 02/08/2015 0.9* 1.0 - 3.6 K/uL Final  . Monocytes Relative 02/08/2015 9   Final  . Monocytes Absolute 02/08/2015 0.4  0.2 - 1.0 K/uL Final  . Eosinophils Relative 02/08/2015 2   Final  . Eosinophils Absolute 02/08/2015 0.1  0 - 0.7 K/uL Final  .  Basophils Relative 02/08/2015 1   Final  . Basophils Absolute 02/08/2015 0.0  0 - 0.1 K/uL Final  . Sodium 02/08/2015 136  135 - 145 mmol/L Final  . Potassium 02/08/2015 4.2  3.5 - 5.1 mmol/L Final  . Chloride 02/08/2015 99* 101 - 111 mmol/L Final  . CO2 02/08/2015 29  22 - 32 mmol/L Final  . Glucose, Bld 02/08/2015 116* 65 - 99 mg/dL Final  . BUN 02/08/2015 16  6 - 20 mg/dL Final  . Creatinine, Ser 02/08/2015 1.03  0.61 - 1.24 mg/dL Final  . Calcium 02/08/2015 9.4  8.9 - 10.3 mg/dL Final  . Total Protein 02/08/2015 7.5  6.5 - 8.1 g/dL Final  . Albumin 02/08/2015 4.6  3.5 - 5.0 g/dL Final  . AST 02/08/2015 29  15 - 41 U/L Final  . ALT 02/08/2015 39  17 - 63 U/L Final  . Alkaline Phosphatase 02/08/2015 72  38 - 126 U/L Final  . Total Bilirubin 02/08/2015 0.8  0.3 - 1.2 mg/dL Final  . GFR calc non Af Amer 02/08/2015 >60  >60 mL/min Final  . GFR calc Af Amer 02/08/2015 >60  >60 mL/min Final   Comment: (NOTE) The eGFR has been calculated using the CKD EPI equation. This calculation has not been validated in all clinical situations. eGFR's persistently <60 mL/min signify possible Chronic Kidney Disease.   . Anion gap 02/08/2015 8  5 - 15 Final     Significant History/PMH:   BPH:    GERD:    HTN:   Smoking History: Smoking History quit 6 years ago.  PFSH: Family History: noncontributory  Comments: did smoked in the past.  Does not chewe tobacco drinks occasionally.Accompanied by male  friend  status is divorced  Additional Past Medical and Surgical History: benign prostatic  hypertrophy, hypertension, gastroesophageal reflux disease    ASSESSMENT: Plan:   1. 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 S/P chemotherapy, radiation, and surgical resection. There is clinically no evidence of recurrent disease.  He has been treated recently for squamous cell carcinoma on the top of the scalp by dermatologist.  2. Abdominal nodule. Noted to be subcentimeter in size, likely a very small hernia. Will continue to observe and if there is any worsening in symptoms or enlarged size will evaluate with CT scan.  .no change in the size of the nodule   All lab data has been reviewed Patient had a recent ENT checkup by Dr. Malon Kindle There is no evidence of recurrent or progressive disease Reevaluate patient in one year or before if there are new symptoms  Patient expressed understanding and was in agreement with this plan. He also understands that He can call clinic at any time with any questions, concerns, or complaints.    No matching staging information was found for the patient.  Forest Gleason, MD   02/08/2015 1:06 PM

## 2015-02-08 NOTE — Telephone Encounter (Signed)
Per Zara Council pt may take medications allowing 4 hrs between them. Notified Brandy at pharmacy. Voices understanding.

## 2015-02-08 NOTE — Telephone Encounter (Signed)
Terry Lowe at [place] states there is an interaction between Viagra & alfuzosin. Please advise.

## 2015-02-08 NOTE — Progress Notes (Addendum)
02/08/2015 2:52 PM   Terry Lowe [birth year] [id number]  Referring provider: Abner Greenspan, MD [name] [place], [place] [address]  Chief Complaint  Patient presents with  . Erectile Dysfunction  . Benign Prostatic Hypertrophy    HPI: Patient is 59 year old white male with erectile dysfunction, hypogonadism and BPH with LUTS who presents today for 6 month follow-up.  Erectile dysfunction His SHIM score is 18, which is mild ED.   He has been having difficulty with erections for over three years.   His major complaint is achieving and erection.  His libido is diminished.   His risk factors for ED are HTN, BPH, age and former smoker.  He denies any painful erections or curvatures with his erections.   He has tried Viagra, Cialis and Staxyn in the past.  He had the best results with the Staxyn, but he is unable to acquire this medication.          SHIM      02/08/15 1404       SHIM: Over the last 6 months:   How do you rate your confidence that you could get and keep an erection? Low     When you had erections with sexual stimulation, how often were your erections hard enough for penetration (entering your partner)? Most Times (much more than half the time)     During sexual intercourse, how often were you able to maintain your erection after you had penetrated (entered) your partner? Slightly Difficult     During sexual intercourse, how difficult was it to maintain your erection to completion of intercourse? Slightly Difficult     When you attempted sexual intercourse, how often was it satisfactory for you? Slightly Difficult     SHIM Total Score   SHIM 18        Score: 1-7 Severe ED 8-11 Moderate ED 12-16 Mild-Moderate ED 17-21 Mild ED 22-25 No ED  Hypogonadism Patient presented with the symptoms of reduced libido and erectile dysfunction.  This is indicated by his responses to the ADAM questionnaire.  His pretreatment  testosterone level was 242 ng/dL on 01/17/2013.  He is currently managing his hypogonadism with AndroGel 3 pumps daily.        Androgen Deficiency in the Aging Male      02/08/15 1400       Androgen Deficiency in the Aging Male   Do you have a decrease in libido (sex drive) Yes     Do you have lack of energy No     Do you have a decrease in strength and/or endurance No     Have you lost height No     Have you noticed a decreased "enjoyment of life" No     Are you sad and/or grumpy No     Are your erections less strong Yes     Have you noticed a recent deterioration in your ability to play sports No     Are you falling asleep after dinner No     Has there been a recent deterioration in your work performance No        BPH WITH LUTS His IPSS score today is 1, which is mild lower urinary tract symptomatology.  He is mostly satisfied with his quality life due to his urinary symptoms.  He denies any dysuria, hematuria or suprapubic pain.  He currently taking alfuzosin 10 mg daily.  He also denies any recent fevers,  chills, nausea or vomiting.  He does not have a family history of PCa.      IPSS      02/08/15 1400       International Prostate Symptom Score   How often have you had the sensation of not emptying your bladder? Not at All     How often have you had to urinate less than every two hours? Not at All     How often have you found you stopped and started again several times when you urinated? Not at All     How often have you found it difficult to postpone urination? Not at All     How often have you had a weak urinary stream? Not at All     How often have you had to strain to start urination? Not at All     How many times did you typically get up at night to urinate? 1 Time     Total IPSS Score 1     Quality of Life due to urinary symptoms   If you were to spend the rest of your life with your urinary condition just the way it is now how would you feel about that? Mostly  Satisfied        Score:  1-7 Mild 8-19 Moderate 20-35 Severe    PMH: Past Medical History  Diagnosis Date  . HTN (hypertension)   . CAD (coronary artery disease)   . Varicose vein     L lower leg- pain  . BPH (benign prostatic hyperplasia)   . Squamous cell carcinoma (HCC)     neck mass- surgery- radical neck dissection/radiation and chemo  . Low testosterone     with replacement  . Hyperlipidemia   . Erectile dysfunction   . Hypogonadism in male     Surgical History: Past Surgical History  Procedure Laterality Date  . Tsa  1960s  . Cath (other)  2007    hospital for chest pain  . Ct of neck  9/10    enlarged lymph node  . Ct of chest  9/10    atelectasis  . Ln r neck  9/10    inconclusive fine needle bx  . Squamous cell carcinoma/metastatic      foundin neck 10/10 radical neck dissection    Home Medications:    Medication List       This list is accurate as of: 02/08/15 11:59 PM.  Always use your most recent med list.               alfuzosin 10 MG 24 hr tablet  Commonly known as:  UROXATRAL  Take 1 tablet (10 mg total) by mouth daily.     ANDROGEL PUMP 20.25 MG/ACT (1.62%) Gel  Generic drug:  Testosterone     Azelastine-Fluticasone 137-50 MCG/ACT Susp  Place 1 spray into the nose daily.     BAYER ASPIRIN EC LOW DOSE 81 MG EC tablet  Generic drug:  aspirin  Take 81 mg by mouth daily.     clomiPHENE 50 MG tablet  Commonly known as:  CLOMID  Take 1/2 tablets daily     enalapril 10 MG tablet  Commonly known as:  VASOTEC  Take 1 tablet (10 mg total) by mouth daily.     Fish Oil 1000 MG Caps  Take by mouth. Take 4     hydrochlorothiazide 25 MG tablet  Commonly known as:  HYDRODIURIL  Take 1 tablet (25 mg total)  by mouth daily.     sildenafil 100 MG tablet  Commonly known as:  VIAGRA  Take 1 tablet (100 mg total) by mouth daily as needed for erectile dysfunction.     vitamin C 500 MG tablet  Commonly known as:  ASCORBIC ACID  Take  500 mg by mouth daily.        Allergies:  Allergies  Allergen Reactions  . Pneumococcal Vaccine Polyvalent     REACTION: itching for over a year  . Statins     REACTION: myalgia severe    Family History: Family History  Problem Relation Age of Onset  . Coronary artery disease Father   . Melanoma Father   . Heart disease Father     CAD  . Cancer Father     melanoma  . Breast cancer Mother     Social History:  reports that he has quit smoking. He does not have any smokeless tobacco history on file. He reports that he drinks alcohol. He reports that he does not use illicit drugs.  ROS: UROLOGY Frequent Urination?: No Hard to postpone urination?: No Burning/pain with urination?: No Get up at night to urinate?: No Leakage of urine?: No Urine stream starts and stops?: No Trouble starting stream?: No Do you have to strain to urinate?: No Blood in urine?: No Urinary tract infection?: No Sexually transmitted disease?: No Injury to kidneys or bladder?: No Painful intercourse?: No Weak stream?: No Erection problems?: No Penile pain?: No  Gastrointestinal Nausea?: No Vomiting?: No Indigestion/heartburn?: No Diarrhea?: No Constipation?: No  Constitutional Fever: No Night sweats?: No Weight loss?: No Fatigue?: No  Skin Skin rash/lesions?: No Itching?: No  Eyes Blurred vision?: No Double vision?: No  Ears/Nose/Throat Sore throat?: No Sinus problems?: No  Hematologic/Lymphatic Swollen glands?: No Easy bruising?: No  Cardiovascular Leg swelling?: No Chest pain?: No  Respiratory Cough?: No Shortness of breath?: No  Endocrine Excessive thirst?: No  Musculoskeletal Back pain?: No Joint pain?: No  Neurological Headaches?: No Dizziness?: No  Psychologic Depression?: No Anxiety?: No  Physical Exam: BP 157/96 mmHg  Pulse 98  Ht 6' (1.829 m)  Wt 199 lb 8 oz (90.493 kg)  BMI 27.05 kg/m2  GU: No CVA tenderness.  No bladder fullness or  masses.  Patient with circumcised phallus. Foreskin easily retracted  Urethral meatus is patent.  No penile discharge. No penile lesions or rashes. Scrotum without lesions, cysts, rashes and/or edema.  Testicles are located scrotally bilaterally, they are atrophic. No masses are appreciated in the testicles. Left and right epididymis are normal. Rectal: Patient with  normal sphincter tone. Anus and perineum without scarring or rashes. No rectal masses are appreciated. Prostate is approximately 50 grams, no nodules are appreciated. Seminal vesicles are normal.   Laboratory Data: Lab Results  Component Value Date   WBC 3.8 02/08/2015   HGB 16.9 02/08/2015   HCT 50.3 02/08/2015   MCV 87.7 02/08/2015   PLT 168 02/08/2015    Lab Results  Component Value Date   CREATININE 1.03 02/08/2015  PSA History  1.4 ng/mL on 06/05/2013  1.9 ng/mL on 12/05/2013  1.5 ng/mL on 07/06/2014    Lab Results  Component Value Date   PSA 1.7 02/01/2015    Lab Results  Component Value Date   TESTOSTERONE 199* 02/01/2015     Assessment & Plan:    1. Erectile dysfunction:   Patient's ED responds best to PDE5-inhibitors.  He would like a prescription of Viagra called into his pharmacy.  He will return in 6 months for SHIM score and exam.  2. Hypogonadism:   Patient is not having a therapeutic response to the gels.  He suffered from elevated HCT with the depot-testosterone, so he is not a candidate for the injections.   I also discussed that some men have had success using clomid for hypogonadism.  It does seem to be more successful in younger men, but there are incidences of good results in middle-aged men.  I explained that it is used in male infertility to stimulate the testicles to make more testosterone/sperm.  There has been no long term data on side effects, but some urologists has been having success with this medication.  He would like to try the Clomid.  A prescription has been called into his  pharmacy and he will return in one month for an am serum testosterone draw.    3. BPH with LUTS:   IPSS score is 1/2.  He will continue the alfuzosin 10 mg daily.  A refill is sent to his pharmacy.  He will return to the clinic in 6 months for IPSS score, PSA and exam.     Return in about 1 month (around 03/10/2015) for AM serum testosterone level.  Zara Council, [facility] [address] [place], [place] [address] [phone]

## 2015-02-09 DIAGNOSIS — N529 Male erectile dysfunction, unspecified: Secondary | ICD-10-CM | POA: Insufficient documentation

## 2015-02-09 DIAGNOSIS — N401 Enlarged prostate with lower urinary tract symptoms: Secondary | ICD-10-CM

## 2015-02-09 DIAGNOSIS — N138 Other obstructive and reflux uropathy: Secondary | ICD-10-CM | POA: Insufficient documentation

## 2015-02-09 LAB — T4: T4, Total: 7.4 ug/dL (ref 4.5–12.0)

## 2015-02-24 ENCOUNTER — Ambulatory Visit (INDEPENDENT_AMBULATORY_CARE_PROVIDER_SITE_OTHER): Payer: BLUE CROSS/BLUE SHIELD | Admitting: Family Medicine

## 2015-02-24 ENCOUNTER — Encounter: Payer: Self-pay | Admitting: Family Medicine

## 2015-02-24 VITALS — BP 134/82 | HR 94 | Temp 98.9°F | Ht 72.0 in | Wt 199.8 lb

## 2015-02-24 DIAGNOSIS — J069 Acute upper respiratory infection, unspecified: Secondary | ICD-10-CM

## 2015-02-24 NOTE — Patient Instructions (Signed)
Nice to meet you. You have a viral upper respiratory infection. You can try Flonase over-the-counter 2 sprays in each nostril daily for this. He should continue Claritin. I would avoid Sudafed. Please monitor this and if you do not improve in the next 3-4 days please let us know. If you develop shortness of breath, fevers, chest pain, or any new or change in symptoms please seek medical attention.

## 2015-02-24 NOTE — Progress Notes (Signed)
Patient ID: Terry Lowe, male   DOB: May 24, 1955, 59 y.o.   MRN: [id number]  Tommi Rumps, MD Phone: [phone]  Terry Lowe is a 59 y.o. male who presents today for same-day visit.  Viral URI: Patient notes starting on Sunday he developed rhinorrhea, postnasal drip, some ear fullness, nasal congestion, and sore throat. He's had minimal cough with this. Is not productive. He has no sinus pressure with this. He has had no fevers. He said no shortness of breath. Does notes contacts at work. He has tried Claritin-D and another over-the-counter medicine that did not help.  PMH: Former smoker   ROS see history of present illness  Objective  Physical Exam Filed Vitals:   02/24/15 1005  BP: 134/82  Pulse: 94  Temp: 98.9 F (37.2 C)    Physical Exam  Constitutional: He is well-developed, well-nourished, and in no distress.  HENT:  Head: Normocephalic and atraumatic.  Right Ear: External ear normal.  Left Ear: External ear normal.  Mild posterior oropharyngeal erythema, no exudate, normal TMs bilaterally  Eyes: Conjunctivae are normal. Pupils are equal, round, and reactive to light.  Neck: Neck supple.  Cardiovascular: Normal rate, regular rhythm and normal heart sounds.  Exam reveals no gallop and no friction rub.   No murmur heard. Pulmonary/Chest: Effort normal and breath sounds normal. No respiratory distress. He has no wheezes. He has no rales.  Lymphadenopathy:    He has no cervical adenopathy.  Skin: Skin is warm and dry. He is not diaphoretic.     Assessment/Plan: Please see individual problem list.  Viral URI Symptoms most consistent with viral upper respiratory infection, though could be allergic rhinitis. Patient does have a history of allergic rhinitis. No exudate in his throat and Centor score of -1 make strep pharyngitis unlikely. Discussed that this is likely viral in nature given duration and symptoms. Advised that this is unlikely to be bacterial and  that antibiotics would not be helpful at this time. He will continue Claritin, though will leave out Sudafed. Advised that he could try non-decongestant Mucinex. He will start on Flonase 2 sprays each nostril daily for this. If not improved in 3-4 days he will follow-up. He is given return precautions.   Tommi Rumps

## 2015-02-24 NOTE — Assessment & Plan Note (Signed)
Symptoms most consistent with viral upper respiratory infection, though could be allergic rhinitis. Patient does have a history of allergic rhinitis. No exudate in his throat and Centor score of -1 make strep pharyngitis unlikely. Discussed that this is likely viral in nature given duration and symptoms. Advised that this is unlikely to be bacterial and that antibiotics would not be helpful at this time. He will continue Claritin, though will leave out Sudafed. Advised that he could try non-decongestant Mucinex. He will start on Flonase 2 sprays each nostril daily for this. If not improved in 3-4 days he will follow-up. He is given return precautions.

## 2015-02-24 NOTE — Progress Notes (Signed)
Pre visit review using our clinic review tool, if applicable. No additional management support is needed unless otherwise documented below in the visit note. 

## 2015-03-15 ENCOUNTER — Other Ambulatory Visit: Payer: BLUE CROSS/BLUE SHIELD

## 2015-03-15 DIAGNOSIS — E291 Testicular hypofunction: Secondary | ICD-10-CM

## 2015-03-16 ENCOUNTER — Telehealth: Payer: Self-pay

## 2015-03-16 DIAGNOSIS — E291 Testicular hypofunction: Secondary | ICD-10-CM

## 2015-03-16 LAB — TESTOSTERONE: TESTOSTERONE: 407 ng/dL (ref 348–1197)

## 2015-03-16 NOTE — Telephone Encounter (Signed)
-----   Message from Nori Riis, PA-C sent at 03/16/2015  8:24 AM EST ----- Patient's testosterone is normal.  He will need to come in 2 months for blood work.  He will need a HCT and morning testosterone draw.

## 2015-03-16 NOTE — Telephone Encounter (Signed)
Spoke with pt in reference to blood work. Pt voiced understanding. Pt will RTC 05/24/15 for labs.

## 2015-03-16 NOTE — Telephone Encounter (Signed)
Left a message with male of the home to have pt return call.

## 2015-05-24 ENCOUNTER — Other Ambulatory Visit: Payer: Self-pay

## 2015-05-31 ENCOUNTER — Other Ambulatory Visit: Payer: BLUE CROSS/BLUE SHIELD

## 2015-05-31 DIAGNOSIS — E291 Testicular hypofunction: Secondary | ICD-10-CM

## 2015-06-01 LAB — HEMATOCRIT: Hematocrit: 49.1 % (ref 37.5–51.0)

## 2015-06-01 LAB — TESTOSTERONE: Testosterone: 434 ng/dL (ref 348–1197)

## 2015-06-11 ENCOUNTER — Telehealth: Payer: Self-pay

## 2015-06-11 NOTE — Telephone Encounter (Signed)
LMOM- labs good. Will need labs again in 88mo.

## 2015-06-11 NOTE — Telephone Encounter (Signed)
-----   Message from Nori Riis, PA-C sent at 06/08/2015  1:42 PM EDT ----- Patient's labs are good.  I need to see him in three months with morning labs.

## 2015-08-07 ENCOUNTER — Telehealth: Payer: Self-pay | Admitting: Family Medicine

## 2015-08-07 DIAGNOSIS — Z0000 Encounter for general adult medical examination without abnormal findings: Secondary | ICD-10-CM | POA: Insufficient documentation

## 2015-08-07 DIAGNOSIS — I10 Essential (primary) hypertension: Secondary | ICD-10-CM

## 2015-08-07 DIAGNOSIS — N401 Enlarged prostate with lower urinary tract symptoms: Secondary | ICD-10-CM

## 2015-08-07 DIAGNOSIS — N138 Other obstructive and reflux uropathy: Secondary | ICD-10-CM

## 2015-08-07 NOTE — Telephone Encounter (Signed)
-----   Message from Marchia Bond sent at 08/06/2015 11:16 AM EDT ----- Regarding: Cpx labs Mon 5/22, need orders. Thanks! :-) Please order  future cpx labs for pt's upcoming lab appt. Thanks Aniceto Boss

## 2015-08-12 ENCOUNTER — Encounter: Payer: Self-pay | Admitting: Family Medicine

## 2015-08-16 ENCOUNTER — Other Ambulatory Visit: Payer: BLUE CROSS/BLUE SHIELD

## 2015-08-28 ENCOUNTER — Other Ambulatory Visit: Payer: Self-pay | Admitting: Family Medicine

## 2015-08-30 ENCOUNTER — Other Ambulatory Visit (INDEPENDENT_AMBULATORY_CARE_PROVIDER_SITE_OTHER): Payer: BLUE CROSS/BLUE SHIELD

## 2015-08-30 ENCOUNTER — Ambulatory Visit (INDEPENDENT_AMBULATORY_CARE_PROVIDER_SITE_OTHER): Payer: BLUE CROSS/BLUE SHIELD | Admitting: Family Medicine

## 2015-08-30 ENCOUNTER — Encounter: Payer: Self-pay | Admitting: Family Medicine

## 2015-08-30 VITALS — BP 138/82 | HR 85 | Temp 97.9°F | Ht 71.0 in | Wt 198.5 lb

## 2015-08-30 DIAGNOSIS — N138 Other obstructive and reflux uropathy: Secondary | ICD-10-CM

## 2015-08-30 DIAGNOSIS — I10 Essential (primary) hypertension: Secondary | ICD-10-CM | POA: Diagnosis not present

## 2015-08-30 DIAGNOSIS — Z1211 Encounter for screening for malignant neoplasm of colon: Secondary | ICD-10-CM

## 2015-08-30 DIAGNOSIS — Z0000 Encounter for general adult medical examination without abnormal findings: Secondary | ICD-10-CM

## 2015-08-30 DIAGNOSIS — N401 Enlarged prostate with lower urinary tract symptoms: Secondary | ICD-10-CM | POA: Diagnosis not present

## 2015-08-30 DIAGNOSIS — E785 Hyperlipidemia, unspecified: Secondary | ICD-10-CM

## 2015-08-30 DIAGNOSIS — Z8589 Personal history of malignant neoplasm of other organs and systems: Secondary | ICD-10-CM

## 2015-08-30 DIAGNOSIS — R739 Hyperglycemia, unspecified: Secondary | ICD-10-CM

## 2015-08-30 LAB — COMPREHENSIVE METABOLIC PANEL
ALBUMIN: 4.2 g/dL (ref 3.5–5.2)
ALT: 24 U/L (ref 0–53)
AST: 19 U/L (ref 0–37)
Alkaline Phosphatase: 57 U/L (ref 39–117)
BUN: 20 mg/dL (ref 6–23)
CHLORIDE: 106 meq/L (ref 96–112)
CO2: 29 meq/L (ref 19–32)
Calcium: 9.2 mg/dL (ref 8.4–10.5)
Creatinine, Ser: 0.98 mg/dL (ref 0.40–1.50)
GFR: 83.34 mL/min (ref >60.00)
Glucose, Bld: 124 mg/dL — ABNORMAL HIGH (ref 70–99)
POTASSIUM: 4.3 meq/L (ref 3.5–5.1)
SODIUM: 142 meq/L (ref 135–145)
Total Bilirubin: 0.5 mg/dL (ref 0.2–1.2)
Total Protein: 6.4 g/dL (ref 6.0–8.3)

## 2015-08-30 LAB — CBC WITH DIFFERENTIAL/PLATELET
BASOS PCT: 0.5 % (ref 0.0–3.0)
Basophils Absolute: 0 10*3/uL (ref 0.0–0.1)
EOS PCT: 2 % (ref 0.0–5.0)
Eosinophils Absolute: 0.1 10*3/uL (ref 0.0–0.7)
HEMATOCRIT: 46 % (ref 39.0–52.0)
HEMOGLOBIN: 15.5 g/dL (ref 13.0–17.0)
Lymphocytes Relative: 22.3 % (ref 12.0–46.0)
Lymphs Abs: 0.9 10*3/uL (ref 0.7–4.0)
MCHC: 33.7 g/dL (ref 30.0–36.0)
MCV: 88.3 fl (ref 78.0–100.0)
MONO ABS: 0.4 10*3/uL (ref 0.1–1.0)
Monocytes Relative: 9.9 % (ref 3.0–12.0)
Neutro Abs: 2.7 10*3/uL (ref 1.4–7.7)
Neutrophils Relative %: 65.3 % (ref 43.0–77.0)
Platelets: 167 10*3/uL (ref 150.0–400.0)
RBC: 5.2 Mil/uL (ref 4.22–5.81)
RDW: 12.9 % (ref 11.5–15.5)
WBC: 4.2 10*3/uL (ref 4.0–10.5)

## 2015-08-30 LAB — LIPID PANEL
CHOL/HDL RATIO: 5
CHOLESTEROL: 178 mg/dL (ref 0–200)
HDL: 39.5 mg/dL (ref >39.00)
LDL CALC: 115 mg/dL — AB (ref 0–99)
NonHDL: 138.58
Triglycerides: 117 mg/dL (ref 0.0–149.0)
VLDL: 23.4 mg/dL (ref 0.0–40.0)

## 2015-08-30 LAB — TSH: TSH: 2.41 u[IU]/mL (ref 0.35–4.50)

## 2015-08-30 MED ORDER — HYDROCHLOROTHIAZIDE 25 MG PO TABS
25.0000 mg | ORAL_TABLET | Freq: Every day | ORAL | Status: DC
Start: 2015-08-30 — End: 2016-10-05

## 2015-08-30 MED ORDER — ENALAPRIL MALEATE 10 MG PO TABS: 10.0000 mg | ORAL_TABLET | Freq: Every day | ORAL | Status: DC

## 2015-08-30 NOTE — Assessment & Plan Note (Signed)
Referred for first screening colonoscopy  

## 2015-08-30 NOTE — Assessment & Plan Note (Signed)
Pt sees dermatology frequently Has had other squamous and basal cell lesion removals Uses sun protection regularly

## 2015-08-30 NOTE — Assessment & Plan Note (Signed)
Disc goals for lipids and reasons to control them Rev labs with pt Rev low sat fat diet in detail Pt is intol of statins and does have hx of CAD Pending labs from this am  emph imp of low sat fat diet  Is very active

## 2015-08-30 NOTE — Patient Instructions (Addendum)
Stop at check out for referral for colonoscopy  Take care of yourself  We will see how labs look  For cholesterol--  Avoid red meat/ fried foods/ egg yolks/ fatty breakfast meats/ butter, cheese and high fat dairy/ and shellfish   Continue follow up with urology  Continue follow up with dermatology

## 2015-08-30 NOTE — Assessment & Plan Note (Signed)
bp in fair control at this time  BP Readings from Last 1 Encounters:  08/30/15 138/82   No changes needed Disc lifstyle change with low sodium diet and exercise  Pt feels best with systolic bp over [id number] (otherwise feels tired and dizzy) Labs pending from today

## 2015-08-30 NOTE — Progress Notes (Signed)
Subjective:    Patient ID: Terry Lowe, male    DOB: May 10, 1955, 60 y.o.   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 Good with that -does not have to go back for a year   Sees dermatology  Has had squamous cell in ear and head  Goes to dermatology - 2 of them  Recently had a pyogenic granuloma removed - on his cheek He wears sunscreen    bp is stable today (feels better with systolic at [id number] or above) No cp or palpitations or headaches or edema  No side effects to medicines  BP Readings from Last 3 Encounters:  08/30/15 138/82  02/24/15 134/82  02/08/15 157/96      Hx of CAD Due for labs Lab Results  Component Value Date   CHOL 192 06/20/2013   HDL 36.00* 06/20/2013   LDLCALC 135* 06/20/2013   LDLDIRECT 133.7 09/28/2009   TRIG 106.0 06/20/2013   CHOLHDL 5 06/20/2013  he gets severe myalgia from statins  Hx of CAD-no cp or sob or symptoms  On asa 81 mg  On enalapril and hctz and fish  Tries to eat a healthy diet - avoids red meat/ and no sweets at all  Avoids fried foods for the most part  Not a lot of bacon/sausage   Avoids white carbohydrates - keeps wt stable  bmi of 27  Lots of exercise with work = very physical job  Does not get tired during the day- does very well   Hx of  BPH and also low testosterone  He is on clomid - forgets to take it  Has frequent follow ups  Labs ? 3/17- per pt psa was normal  and also HCT   Had labs drawn today   Patient Active Problem List   Diagnosis Date Noted  . Routine general medical examination at a health care facility 08/07/2015  . Erectile dysfunction of organic origin 02/09/2015  . BPH with obstruction/lower urinary tract symptoms 02/09/2015  . Hypogonadism in male 02/08/2015  . Allergic rhinitis 08/21/2014  . CARCINOMA, SQUAMOUS CELL 09/28/2009  . Hyperlipidemia 06/03/2007  . 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 TESTOSTERONE DEFICIENCY 06/25/2006  . Essential hypertension 06/25/2006  . CAD 06/25/2006   Past Medical History  Diagnosis Date  . HTN (hypertension)   . CAD (coronary artery disease)   . Varicose vein     L lower leg- pain  . BPH (benign prostatic hyperplasia)   . Squamous cell carcinoma (HCC)     neck mass- surgery- radical neck dissection/radiation and chemo  . Low testosterone     with replacement  . Hyperlipidemia   . Erectile dysfunction   . Hypogonadism in male    Past Surgical History  Procedure Laterality Date  . [place]  1960s  . Cath (other)  2007  hospital for chest pain  . Ct of neck  9/10    enlarged lymph node  . Ct of chest  9/10    atelectasis  . Ln r neck  9/10    inconclusive fine needle bx  . Squamous cell carcinoma/metastatic      foundin neck 10/10 radical neck dissection   Social History  Substance Use Topics  . Smoking status: Former Research scientist (life sciences)  . Smokeless tobacco: None     Comment: quit 20 years ago  . Alcohol Use: 0.0 oz/week    0 Standard drinks or equivalent per week     Comment: 1-2 beers per day occas   Family History  Problem Relation Age of Onset  . Coronary artery disease Father   . Melanoma Father   . Heart disease Father     CAD  . Cancer Father     melanoma  . Breast cancer Mother    Allergies  Allergen Reactions  . Pneumococcal Vaccine Polyvalent     REACTION: itching for over a year  . Statins     REACTION: myalgia severe   Current Outpatient Prescriptions on File Prior to  Visit  Medication Sig Dispense Refill  . alfuzosin (UROXATRAL) 10 MG 24 hr tablet Take 1 tablet (10 mg total) by mouth daily. 30 tablet 12  . aspirin (BAYER ASPIRIN EC LOW DOSE) 81 MG EC tablet Take 81 mg by mouth daily.      . clomiPHENE (CLOMID) 50 MG tablet Take 1/2 tablets daily 30 tablet 3  . enalapril (VASOTEC) 10 MG tablet Take 1 tablet (10 mg total) by mouth daily. 30 tablet 11  . hydrochlorothiazide (HYDRODIURIL) 25 MG tablet Take 1 tablet (25 mg total) by mouth daily. 30 tablet 11  . Omega-3 Fatty Acids (FISH OIL) 1000 MG CAPS Take by mouth. Take 4     . sildenafil (VIAGRA) 100 MG tablet Take 1 tablet (100 mg total) by mouth daily as needed for erectile dysfunction. 10 tablet 12  . vitamin C (ASCORBIC ACID) 500 MG tablet Take 500 mg by mouth daily.     No current facility-administered medications on file prior to visit.     Review of Systems Review of Systems  Constitutional: Negative for fever, appetite change, fatigue and unexpected weight change.  Eyes: Negative for pain and visual disturbance.  Respiratory: Negative for cough and shortness of breath.   Cardiovascular: Negative for cp or palpitations    Gastrointestinal: Negative for nausea, diarrhea and constipation.  Genitourinary: Negative for urgency and frequency.  Skin: Negative for pallor or rash   Neurological: Negative for weakness, light-headedness, numbness and headaches.  Hematological: Negative for adenopathy. Does not bruise/bleed easily.  Psychiatric/Behavioral: Negative for dysphoric mood. The patient is not nervous/anxious.         Objective:   Physical Exam  Constitutional: He appears well-developed and well-nourished. No distress.  Well appearing   HENT:  Head: Normocephalic and atraumatic.  Right Ear: External ear normal.  Left Ear: External ear normal.  Nose: Nose normal.  Mouth/Throat: Oropharynx is clear and moist.  Nares are boggy  Eyes: Conjunctivae and EOM are normal. Pupils are equal,  round, and reactive to light. Right eye exhibits no discharge. Left eye exhibits no discharge. No scleral icterus.  Neck: Normal range of motion. Neck supple. No JVD present. Carotid bruit is not present. No thyromegaly present.  Cardiovascular: Normal rate, regular rhythm, normal heart sounds and intact distal pulses.  Exam reveals no gallop.  Varicosities on LEs -worse on Left  Pulmonary/Chest: Effort normal and breath sounds normal. No respiratory distress. He has no wheezes. He exhibits no tenderness.  Abdominal: Soft. Bowel sounds are normal. He exhibits no distension, no abdominal bruit and no mass. There is no tenderness.  Musculoskeletal: He exhibits no edema or tenderness.  Lymphadenopathy:    He has no cervical adenopathy.  Neurological: He is alert. He has normal reflexes. No cranial nerve deficit. He exhibits normal muscle tone. Coordination normal.  Skin: Skin is warm and dry. No rash noted. No erythema. No pallor.  Tanned with many solar lentigines  Also recent mole removal site on L ear is healing  Psychiatric: He has a normal mood and affect.          Assessment & Plan:   Problem List Items Addressed This Visit      Cardiovascular and Mediastinum   Essential hypertension    bp in fair control at this time  BP Readings from Last 1 Encounters:  08/30/15 138/82   No changes needed Disc lifstyle change with low sodium diet and exercise  Pt feels best with systolic bp over [id number] (otherwise feels tired and dizzy) Labs pending from today      Relevant Medications   hydrochlorothiazide (HYDRODIURIL) 25 MG tablet   enalapril (VASOTEC) 10 MG tablet     Genitourinary   BPH with obstruction/lower urinary tract symptoms    Sees urology Per pt nl psa in March Has f/u soon        Other   Routine general medical examination at a health care facility - Primary    Reviewed health habits including diet and exercise and skin cancer prevention Reviewed appropriate  screening tests for age  Also reviewed health mt list, fam hx and immunization status , as well as social and family history   See HPI Disc loss of his father this year utd derm and urology visits Had labs this am  decl Hep C/ HIV screen due to low risk  Enc balanced lifestyle with good self care       Hyperlipidemia    Disc goals for lipids and reasons to control them Rev labs with pt Rev low sat fat diet in detail Pt is intol of statins and does have hx of CAD Pending labs from this am  emph imp of low sat fat diet  Is very active       Relevant Medications   hydrochlorothiazide (HYDRODIURIL) 25 MG tablet   enalapril (VASOTEC) 10 MG tablet   History of squamous cell carcinoma    Pt sees dermatology frequently Has had other squamous and basal cell lesion removals Uses sun protection regularly      Colon cancer screening    Referred for first screening colonoscopy      Relevant Orders   Ambulatory referral to Gastroenterology

## 2015-08-30 NOTE — Progress Notes (Signed)
Pre visit review using our clinic review tool, if applicable. No additional management support is needed unless otherwise documented below in the visit note. 

## 2015-08-30 NOTE — Assessment & Plan Note (Signed)
Reviewed health habits including diet and exercise and skin cancer prevention Reviewed appropriate screening tests for age  Also reviewed health mt list, fam hx and immunization status , as well as social and family history   See HPI Disc loss of his father this year utd derm and urology visits Had labs this am  decl Hep C/ HIV screen due to low risk  Enc balanced lifestyle with good self care

## 2015-08-30 NOTE — Assessment & Plan Note (Signed)
Sees urology Per pt nl psa in March Has f/u soon

## 2015-08-31 ENCOUNTER — Encounter: Payer: Self-pay | Admitting: *Deleted

## 2015-08-31 DIAGNOSIS — R739 Hyperglycemia, unspecified: Secondary | ICD-10-CM

## 2015-08-31 DIAGNOSIS — R7303 Prediabetes: Secondary | ICD-10-CM | POA: Insufficient documentation

## 2015-09-03 ENCOUNTER — Other Ambulatory Visit: Payer: Self-pay | Admitting: Family Medicine

## 2015-11-19 ENCOUNTER — Encounter: Payer: Self-pay | Admitting: *Deleted

## 2015-11-22 ENCOUNTER — Ambulatory Visit: Payer: BLUE CROSS/BLUE SHIELD | Admitting: Anesthesiology

## 2015-11-22 ENCOUNTER — Encounter
Admission: RE | Disposition: A | Discharge status: Home / Self Care | Payer: Self-pay | Source: Ambulatory Visit | Attending: Unknown Physician Specialty

## 2015-11-22 ENCOUNTER — Ambulatory Visit
Admission: RE | Admit: 2015-11-22 | Discharge: 2015-11-22 | Disposition: A | Discharge status: Home / Self Care | Payer: BLUE CROSS/BLUE SHIELD | Source: Ambulatory Visit | Attending: Unknown Physician Specialty | Admitting: Unknown Physician Specialty

## 2015-11-22 ENCOUNTER — Encounter: Payer: Self-pay | Admitting: *Deleted

## 2015-11-22 DIAGNOSIS — Z8371 Family history of colonic polyps: Secondary | ICD-10-CM | POA: Insufficient documentation

## 2015-11-22 DIAGNOSIS — I10 Essential (primary) hypertension: Secondary | ICD-10-CM | POA: Insufficient documentation

## 2015-11-22 DIAGNOSIS — E785 Hyperlipidemia, unspecified: Secondary | ICD-10-CM | POA: Diagnosis not present

## 2015-11-22 DIAGNOSIS — Z7982 Long term (current) use of aspirin: Secondary | ICD-10-CM | POA: Diagnosis not present

## 2015-11-22 DIAGNOSIS — K635 Polyp of colon: Secondary | ICD-10-CM | POA: Insufficient documentation

## 2015-11-22 DIAGNOSIS — Z888 Allergy status to other drugs, medicaments and biological substances status: Secondary | ICD-10-CM | POA: Insufficient documentation

## 2015-11-22 DIAGNOSIS — Z808 Family history of malignant neoplasm of other organs or systems: Secondary | ICD-10-CM | POA: Diagnosis not present

## 2015-11-22 DIAGNOSIS — D120 Benign neoplasm of cecum: Secondary | ICD-10-CM | POA: Insufficient documentation

## 2015-11-22 DIAGNOSIS — Z87891 Personal history of nicotine dependence: Secondary | ICD-10-CM | POA: Insufficient documentation

## 2015-11-22 DIAGNOSIS — I2510 Atherosclerotic heart disease of native coronary artery without angina pectoris: Secondary | ICD-10-CM | POA: Diagnosis not present

## 2015-11-22 DIAGNOSIS — N400 Enlarged prostate without lower urinary tract symptoms: Secondary | ICD-10-CM | POA: Insufficient documentation

## 2015-11-22 DIAGNOSIS — Z9889 Other specified postprocedural states: Secondary | ICD-10-CM | POA: Diagnosis not present

## 2015-11-22 DIAGNOSIS — N529 Male erectile dysfunction, unspecified: Secondary | ICD-10-CM | POA: Diagnosis not present

## 2015-11-22 DIAGNOSIS — K5730 Diverticulosis of large intestine without perforation or abscess without bleeding: Secondary | ICD-10-CM | POA: Diagnosis not present

## 2015-11-22 DIAGNOSIS — K640 First degree hemorrhoids: Secondary | ICD-10-CM | POA: Insufficient documentation

## 2015-11-22 DIAGNOSIS — D122 Benign neoplasm of ascending colon: Secondary | ICD-10-CM | POA: Insufficient documentation

## 2015-11-22 DIAGNOSIS — Z8249 Family history of ischemic heart disease and other diseases of the circulatory system: Secondary | ICD-10-CM | POA: Diagnosis not present

## 2015-11-22 DIAGNOSIS — Z887 Allergy status to serum and vaccine status: Secondary | ICD-10-CM | POA: Diagnosis not present

## 2015-11-22 DIAGNOSIS — Z1211 Encounter for screening for malignant neoplasm of colon: Secondary | ICD-10-CM | POA: Insufficient documentation

## 2015-11-22 DIAGNOSIS — D123 Benign neoplasm of transverse colon: Secondary | ICD-10-CM | POA: Diagnosis not present

## 2015-11-22 DIAGNOSIS — Z803 Family history of malignant neoplasm of breast: Secondary | ICD-10-CM | POA: Insufficient documentation

## 2015-11-22 DIAGNOSIS — Z85828 Personal history of other malignant neoplasm of skin: Secondary | ICD-10-CM | POA: Diagnosis not present

## 2015-11-22 DIAGNOSIS — Z79899 Other long term (current) drug therapy: Secondary | ICD-10-CM | POA: Diagnosis not present

## 2015-11-22 HISTORY — PX: COLONOSCOPY WITH PROPOFOL: SHX5780

## 2015-11-22 SURGERY — COLONOSCOPY WITH PROPOFOL
Anesthesia: General

## 2015-11-22 MED ORDER — PROPOFOL 10 MG/ML IV BOLUS
INTRAVENOUS | Status: DC | PRN
  Administered 2015-11-22: 100 mg via INTRAVENOUS

## 2015-11-22 MED ORDER — SODIUM CHLORIDE 0.9 % IV SOLN
INTRAVENOUS | Status: DC
  Administered 2015-11-22: 1000 mL via INTRAVENOUS

## 2015-11-22 MED ORDER — PROPOFOL 500 MG/50ML IV EMUL
INTRAVENOUS | Status: DC | PRN
  Administered 2015-11-22: 120 ug/kg/min via INTRAVENOUS

## 2015-11-22 MED ORDER — SODIUM CHLORIDE 0.9 % IV SOLN
INTRAVENOUS | Status: DC
  Administered 2015-11-22: 15:00:00 via INTRAVENOUS

## 2015-11-22 NOTE — Anesthesia Preprocedure Evaluation (Signed)
Anesthesia Evaluation  Patient identified by MRN, date of birth, ID band Patient awake    Reviewed: Allergy & Precautions, H&P , NPO status , Patient's Chart, lab work & pertinent test results, reviewed documented beta blocker date and time   Airway Mallampati: II   Neck ROM: full    Dental  (+) Teeth Intact   Pulmonary neg pulmonary ROS, former smoker,    Pulmonary exam normal        Cardiovascular Exercise Tolerance: Good hypertension, (-) angina+ CAD  negative cardio ROS Normal cardiovascular exam Rhythm:regular Rate:Normal     Neuro/Psych negative neurological ROS  negative psych ROS   GI/Hepatic negative GI ROS, Neg liver ROS,   Endo/Other  negative endocrine ROS  Renal/GU negative Renal ROS  negative genitourinary   Musculoskeletal   Abdominal   Peds  Hematology negative hematology ROS (+)   Anesthesia Other Findings Past Medical History: No date: BPH (benign prostatic hyperplasia) No date: CAD (coronary artery disease) No date: Erectile dysfunction No date: HTN (hypertension) No date: Hyperlipidemia No date: Hypogonadism in male No date: Low testosterone     Comment: with replacement No date: Squamous cell carcinoma (HCC)     Comment: neck mass- surgery- radical neck               dissection/radiation and chemo No date: Varicose vein     Comment: L lower leg- pain Past Surgical History: 2007: Cath (other)     Comment: hospital for chest pain 9/10: CT of chest     Comment: atelectasis 9/10: CT of neck     Comment: enlarged lymph node 9/10: LN R neck     Comment: inconclusive fine needle bx No date: squamous cell carcinoma/metastatic     Comment: foundin neck 10/10 radical neck dissection 1960s: TSA   Reproductive/Obstetrics                             Anesthesia Physical Anesthesia Plan  ASA: III  Anesthesia Plan: General   Post-op Pain Management:     Induction:   Airway Management Planned:   Additional Equipment:   Intra-op Plan:   Post-operative Plan:   Informed Consent: I have reviewed the patients History and Physical, chart, labs and discussed the procedure including the risks, benefits and alternatives for the proposed anesthesia with the patient or authorized representative who has indicated his/her understanding and acceptance.   Dental Advisory Given  Plan Discussed with: CRNA  Anesthesia Plan Comments:         Anesthesia Quick Evaluation

## 2015-11-22 NOTE — H&P (Signed)
Primary Care Physician:  Loura Pardon, MD Primary Gastroenterologist:  Dr. Vira Agar  Pre-Procedure History & Physical: HPI:  ANTHONNY BURKLEY is a 60 y.o. male is here for an colonoscopy.   Past Medical History:  Diagnosis Date  . BPH (benign prostatic hyperplasia)   . CAD (coronary artery disease)   . Erectile dysfunction   . HTN (hypertension)   . Hyperlipidemia   . Hypogonadism in male   . Low testosterone    with replacement  . Squamous cell carcinoma (HCC)    neck mass- surgery- radical neck dissection/radiation and chemo  . Varicose vein    L lower leg- pain    Past Surgical History:  Procedure Laterality Date  . Cath (other)  2007   hospital for chest pain  . CT of chest  9/10   atelectasis  . CT of neck  9/10   enlarged lymph node  . LN R neck  9/10   inconclusive fine needle bx  . squamous cell carcinoma/metastatic     foundin neck 10/10 radical neck dissection  . TSA  1960s    Prior to Admission medications   Medication Sig Start Date End Date Taking? Authorizing Provider  alfuzosin (UROXATRAL) 10 MG 24 hr tablet Take 1 tablet (10 mg total) by mouth daily. 02/08/15  Yes Nori Riis, PA-C  clomiPHENE (CLOMID) 50 MG tablet Take 1/2 tablets daily 02/08/15  Yes Shannon A McGowan, PA-C  enalapril (VASOTEC) 10 MG tablet Take 1 tablet (10 mg total) by mouth daily. 08/30/15  Yes Abner Greenspan, MD  hydrochlorothiazide (HYDRODIURIL) 25 MG tablet Take 1 tablet (25 mg total) by mouth daily. 08/30/15  Yes Abner Greenspan, MD  Omega-3 Fatty Acids (FISH OIL) 1000 MG CAPS Take by mouth. Take 4    Yes Historical Provider, MD  aspirin (BAYER ASPIRIN EC LOW DOSE) 81 MG EC tablet Take 81 mg by mouth daily.      Historical Provider, MD  sildenafil (VIAGRA) 100 MG tablet Take 1 tablet (100 mg total) by mouth daily as needed for erectile dysfunction. 02/08/15   Nori Riis, PA-C  vitamin C (ASCORBIC ACID) 500 MG tablet Take 500 mg by mouth daily.    Historical Provider, MD     Allergies as of 11/16/2015 - Review Complete 08/30/2015  Allergen Reaction Noted  . Pneumococcal vaccine polyvalent  07/22/2008  . Statins  09/30/2009    Family History  Problem Relation Age of Onset  . Coronary artery disease Father   . Melanoma Father   . Heart disease Father     CAD  . Cancer Father     melanoma  . Breast cancer Mother     Social History   Social History  . Marital status: Single    Spouse name: N/A  . Number of children: N/A  . Years of education: N/A   Occupational History  . Not on file.   Social History Main Topics  . Smoking status: Former Research scientist (life sciences)  . Smokeless tobacco: Never Used     Comment: quit 20 years ago  . Alcohol use 0.0 oz/week     Comment: 1-2 beers per day occas  . Drug use: No  . Sexual activity: Not on file   Other Topics Concern  . Not on file   Social History Narrative  . No narrative on file    Review of Systems: See HPI, otherwise negative ROS  Physical Exam: BP (!) 160/89   Pulse 89  Temp 97.8 F (36.6 C) (Tympanic)   Resp 20   Ht 6' (1.829 m)   Wt 90.7 kg (200 lb)   SpO2 98%   BMI 27.12 kg/m  General:   Alert,  pleasant and cooperative in NAD Head:  Normocephalic and atraumatic. Neck:  Supple; no masses or thyromegaly. Lungs:  Clear throughout to auscultation.    Heart:  Regular rate and rhythm. Abdomen:  Soft, nontender and nondistended. Normal bowel sounds, without guarding, and without rebound.   Neurologic:  Alert and  oriented x4;  grossly normal neurologically.  Impression/Plan: TAYT LOMBARDOZZI is here for an colonoscopy to be performed for screening with FH colon polyp in mother.  Risks, benefits, limitations, and alternatives regarding  colonoscopy have been reviewed with the patient.  Questions have been answered.  All parties agreeable.   Gaylyn Cheers, MD  11/22/2015, 3:13 PM

## 2015-11-22 NOTE — Transfer of Care (Signed)
Immediate Anesthesia Transfer of Care Note  Patient: Terry Lowe  Procedure(s) Performed: Procedure(s): COLONOSCOPY WITH PROPOFOL (N/A)  Patient Location: PACU and Endoscopy Unit  Anesthesia Type:General  Level of Consciousness: patient cooperative and lethargic  Airway & Oxygen Therapy: Patient Spontanous Breathing and Patient connected to nasal cannula oxygen  Post-op Assessment: Report given to RN and Post -op Vital signs reviewed and stable  Post vital signs: Reviewed and stable  Last Vitals:  Vitals:   11/22/15 1440 11/22/15 1555  BP: (!) 160/89 (!) 88/59  Pulse: 89 74  Resp: 20 18  Temp: 36.6 C (!) 35.6 C    Last Pain:  Vitals:   11/22/15 1555  TempSrc: Tympanic         Complications: No apparent anesthesia complications

## 2015-11-22 NOTE — Op Note (Signed)
[facility] Gastroenterology Patient Name: Terry Lowe Procedure Date: 11/22/2015 3:14 PM MRN: [id number] Account #: [id number] Date of Birth: 1955/08/16 Admit Type: Outpatient Age: 60 Room: [facility] ENDO ROOM 1 Gender: Male Note Status: Finalized Procedure:            Colonoscopy Indications:          Screening for colorectal malignant neoplasm Providers:            Manya Silvas, MD Referring MD:         Wynelle Fanny. Tower (Referring MD) Medicines:            Propofol per Anesthesia Complications:        No immediate complications. Procedure:            Pre-Anesthesia Assessment:                       - After reviewing the risks and benefits, the patient                        was deemed in satisfactory condition to undergo the                        procedure.                       After obtaining informed consent, the colonoscope was                        passed under direct vision. Throughout the procedure,                        the patient's blood pressure, pulse, and oxygen                        saturations were monitored continuously. The                        Colonoscope was introduced through the anus and                        advanced to the the cecum, identified by appendiceal                        orifice and ileocecal valve. The colonoscopy was                        performed without difficulty. The patient tolerated the                        procedure well. The quality of the bowel preparation                        was good. Findings:      A diminutive polyp was found in the cecum. The polyp was sessile. The       polyp was removed with a jumbo cold forceps. Resection and retrieval       were complete.      Two sessile polyps were found in the transverse colon. The polyps were       diminutive in size. These polyps were removed with a jumbo cold forceps.  Resection and retrieval were complete.      A diminutive polyp was found in the  ascending colon. The polyp was       sessile. The polyp was removed with a jumbo cold forceps. Resection and       retrieval were complete.      Multiple small-mouthed diverticula were found in the sigmoid colon,       descending colon and transverse colon.      Internal hemorrhoids were found during endoscopy. The hemorrhoids were       small and Grade I (internal hemorrhoids that do not prolapse). Impression:           - One diminutive polyp in the cecum, removed with a                        jumbo cold forceps. Resected and retrieved.                       - Two diminutive polyps in the transverse colon,                        removed with a jumbo cold forceps. Resected and                        retrieved.                       - One diminutive polyp in the ascending colon, removed                        with a jumbo cold forceps. Resected and retrieved.                       - Diverticulosis in the sigmoid colon, in the                        descending colon and in the transverse colon.                       - Internal hemorrhoids. Recommendation:       - Await pathology results. Manya Silvas, MD 11/22/2015 3:53:59 PM This report has been signed electronically. Number of Addenda: 0 Note Initiated On: 11/22/2015 3:14 PM Scope Withdrawal Time: 0 hours 20 minutes 37 seconds  Total Procedure Duration: 0 hours 28 minutes 51 seconds       [facility]

## 2015-11-22 NOTE — Anesthesia Postprocedure Evaluation (Signed)
Anesthesia Post Note  Patient: Terry Lowe  Procedure(s) Performed: Procedure(s) (LRB): COLONOSCOPY WITH PROPOFOL (N/A)  Patient location during evaluation: PACU Anesthesia Type: General Level of consciousness: awake and alert and oriented Pain management: pain level controlled Vital Signs Assessment: post-procedure vital signs reviewed and stable Respiratory status: spontaneous breathing, nonlabored ventilation and respiratory function stable Cardiovascular status: blood pressure returned to baseline and stable Postop Assessment: no signs of nausea or vomiting Anesthetic complications: no    Last Vitals:  Vitals:   11/22/15 1615 11/22/15 1622  BP: (!) 86/62 115/73  Pulse: 76 67  Resp: 15 16  Temp:      Last Pain:  Vitals:   11/22/15 1555  TempSrc: Tympanic  PainSc: Asleep                 [name]

## 2015-11-24 LAB — SURGICAL PATHOLOGY

## 2016-01-06 ENCOUNTER — Encounter: Payer: Self-pay | Admitting: Unknown Physician Specialty

## 2016-02-08 ENCOUNTER — Inpatient Hospital Stay: Payer: BLUE CROSS/BLUE SHIELD

## 2016-02-08 ENCOUNTER — Ambulatory Visit: Payer: BLUE CROSS/BLUE SHIELD | Admitting: Oncology

## 2016-02-08 ENCOUNTER — Inpatient Hospital Stay: Payer: BLUE CROSS/BLUE SHIELD | Admitting: Hematology and Oncology

## 2016-02-08 DIAGNOSIS — C801 Malignant (primary) neoplasm, unspecified: Secondary | ICD-10-CM

## 2016-02-08 DIAGNOSIS — C7989 Secondary malignant neoplasm of other specified sites: Secondary | ICD-10-CM | POA: Insufficient documentation

## 2016-02-08 NOTE — Progress Notes (Deleted)
Uncertain Clinic day:  02/08/2016  Chief Complaint: Terry Lowe is a 60 y.o. 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  HPI: ***  Past Medical History:  Diagnosis Date  . BPH (benign prostatic hyperplasia)   . CAD (coronary artery disease)   . Erectile dysfunction   . HTN (hypertension)   . Hyperlipidemia   . Hypogonadism in male   . Low testosterone    with replacement  . Squamous cell carcinoma    neck mass- surgery- radical neck dissection/radiation and chemo  . Varicose vein    L lower leg- pain    Past Surgical History:  Procedure Laterality Date  . Cath (other)  2007   hospital for chest pain  . COLONOSCOPY WITH PROPOFOL N/A 11/22/2015   Procedure: COLONOSCOPY WITH PROPOFOL;  Surgeon: Manya Silvas, MD;  Location: [facility] ENDOSCOPY;  Service: Endoscopy;  Laterality: N/A;  . CT of chest  9/10   atelectasis  . CT of neck  9/10   enlarged lymph node  . LN R neck  9/10   inconclusive fine needle bx  . squamous cell carcinoma/metastatic     foundin neck 10/10 radical neck dissection  . TSA  1960s    Family History  Problem Relation Age of Onset  . Coronary artery disease Father   . Melanoma Father   . Heart disease Father     CAD  . Cancer Father     melanoma  . Breast cancer Mother     Social History:  reports that he has quit smoking. He has never used smokeless tobacco. He reports that he drinks alcohol. He reports that he does not use drugs.  The patient is accompanied by *** alone today.  Allergies:  Allergies  Allergen Reactions  . Pneumococcal Vaccine Polyvalent     REACTION: itching for over a year  . Statins     REACTION: myalgia severe    Current Medications: Current Outpatient Prescriptions  Medication Sig Dispense Refill  . alfuzosin (UROXATRAL) 10 MG 24 hr tablet Take 1 tablet (10 mg total) by mouth daily. 30 tablet 12  . aspirin (BAYER  ASPIRIN EC LOW DOSE) 81 MG EC tablet Take 81 mg by mouth daily.      . clomiPHENE (CLOMID) 50 MG tablet Take 1/2 tablets daily 30 tablet 3  . enalapril (VASOTEC) 10 MG tablet Take 1 tablet (10 mg total) by mouth daily. 30 tablet 11  . hydrochlorothiazide (HYDRODIURIL) 25 MG tablet Take 1 tablet (25 mg total) by mouth daily. 30 tablet 11  . Omega-3 Fatty Acids (FISH OIL) 1000 MG CAPS Take by mouth. Take 4     . sildenafil (VIAGRA) 100 MG tablet Take 1 tablet (100 mg total) by mouth daily as needed for erectile dysfunction. 10 tablet 12  . vitamin C (ASCORBIC ACID) 500 MG tablet Take 500 mg by mouth daily.     No current facility-administered medications for this visit.     Review of Systems:  GENERAL:  Feels good.  Active.  No fevers, sweats or weight loss. PERFORMANCE STATUS (ECOG):  *** HEENT:  No visual changes, runny nose, sore throat, mouth sores or tenderness. Lungs: No shortness of breath or cough.  No hemoptysis. Cardiac:  No chest pain, palpitations, orthopnea, or PND. GI:  No nausea, vomiting, diarrhea, constipation, melena or hematochezia. GU:  No urgency, frequency, dysuria, or hematuria. Musculoskeletal:  No back  pain.  No joint pain.  No muscle tenderness. Extremities:  No pain or swelling. Skin:  No rashes or skin changes. Neuro:  No headache, numbness or weakness, balance or coordination issues. Endocrine:  No diabetes, thyroid issues, hot flashes or night sweats. Psych:  No mood changes, depression or anxiety. Pain:  No focal pain. Review of systems:  All other systems reviewed and found to be negative.   Physical Exam: There were no vitals taken for this visit. GENERAL:  Well developed, well nourished, sitting comfortably in the exam room in no acute distress. MENTAL STATUS:  Alert and oriented to person, place and time. HEAD:  *** hair.  Normocephalic, atraumatic, face symmetric, no Cushingoid features. EYES:  *** eyes.  Pupils equal round and reactive to light and  accomodation.  No conjunctivitis or scleral icterus. ENT:  Oropharynx clear without lesion.  Tongue normal. Mucous membranes moist.  RESPIRATORY:  Clear to auscultation without rales, wheezes or rhonchi. CARDIOVASCULAR:  Regular rate and rhythm without murmur, rub or gallop. ABDOMEN:  Soft, non-tender, with active bowel sounds, and no hepatosplenomegaly.  No masses. SKIN:  No rashes, ulcers or lesions. EXTREMITIES: No edema, no skin discoloration or tenderness.  No palpable cords. LYMPH NODES: No palpable cervical, supraclavicular, axillary or inguinal adenopathy  NEUROLOGICAL: Unremarkable. PSYCH:  Appropriate.  No visits with results within 3 Day(s) from this visit.  Latest known visit with results is:  Admission on 11/22/2015, Discharged on 11/22/2015  Component Date Value Ref Range Status  . SURGICAL PATHOLOGY 11/24/2015    Final                   Value:Surgical Pathology CASE: [id number] PATIENT: Terry Lowe Surgical Pathology Report     SPECIMEN SUBMITTED: A. Colon polyp, cecum; cold bx B. Colon polyp, ascending; cold bx C. Colon polyp, transverse; cold bx D. Colon polyp, transverse; cold bx  CLINICAL HISTORY: None provided  PRE-OPERATIVE DIAGNOSIS:   POST-OPERATIVE DIAGNOSIS: Diverticulosis, hemorrhoids, colon polyp     DIAGNOSIS: A. COLON POLYP, CECUM; COLD BIOPSY: - TUBULAR ADENOMA. - NEGATIVE FOR HIGH-GRADE DYSPLASIA AND MALIGNANCY.  B. COLON POLYP, ASCENDING; COLD BIOPSY: - TUBULAR ADENOMA, 1 OF 2 FRAGMENTS. - NEGATIVE FOR HIGH-GRADE DYSPLASIA AND MALIGNANCY.  C. COLON POLYP, TRANSVERSE; COLD BIOPSY: - HYPERPLASTIC POLYP, 1 OF 2 FRAGMENTS. - NEGATIVE FOR DYSPLASIA AND MALIGNANCY.  D. COLON POLYP, TRANSVERSE; COLD BIOPSY: - TUBULAR ADENOMA, 1 OF 3 FRAGMENTS. - NEGATIVE FOR HIGH-GRADE DYSPLASIA AND MALIGNANCY.   GROSS DESCRIPTION: A. Labeled: C BX cecal polyp Tissue fragment(s):                          1 Size: 0.4 cm Description:  tan  Entirely submitted in 1 cassette(s).   B. Labeled: C BX ascending colon polyp Tissue fragment(s): 2 Size: 0.3 cm Description: tan fragments and a small amount of fecal material  Entirely submitted in 1 cassette(s).   C. Labeled: C BX transverse colon polyp Tissue fragment(s): 2 Size: 0.3 and 0.4 cm Description: tan to brown  Entirely submitted in 1 cassette(s).   D. Labeled: C BX transverse colon polyp Tissue fragment(s): 3 Size: less than 0.1-0.3 cm Description: tan to brown  Entirely submitted in 1 cassette(s).  Final Diagnosis performed by Bryan Lemma, MD.  Electronically signed 11/24/2015 6:42:14PM    The electronic signature indicates that the named Attending Pathologist has evaluated the specimen  Technical component performed at [facility], [address], [place], [place] [address] Lab:  [place]: Darrick Penna. Evette Doffing, MD  Professional component performed at [facility], [facility], [place], [place], [place] [address] Lab: [phone] Dir: Dellia Nims. Reuel Derby, MD      Assessment:  Terry Lowe is a 60 y.o. male ***  Plan: 1. *** 2. *** 3. *** 4. *** 5. ***  Lequita Asal, MD  02/08/2016, 5:54 AM

## 2016-02-12 ENCOUNTER — Other Ambulatory Visit: Payer: Self-pay | Admitting: Urology

## 2016-02-14 ENCOUNTER — Other Ambulatory Visit: Payer: Self-pay | Admitting: Urology

## 2016-02-14 DIAGNOSIS — E291 Testicular hypofunction: Secondary | ICD-10-CM

## 2016-02-14 NOTE — Telephone Encounter (Signed)
Patient will need an appointment before more refills can be given.

## 2016-02-22 ENCOUNTER — Other Ambulatory Visit: Payer: Self-pay | Admitting: *Deleted

## 2016-02-22 DIAGNOSIS — C770 Secondary and unspecified malignant neoplasm of lymph nodes of head, face and neck: Secondary | ICD-10-CM

## 2016-02-24 ENCOUNTER — Inpatient Hospital Stay: Payer: BLUE CROSS/BLUE SHIELD | Admitting: Hematology and Oncology

## 2016-02-24 ENCOUNTER — Inpatient Hospital Stay: Payer: BLUE CROSS/BLUE SHIELD

## 2016-03-14 ENCOUNTER — Other Ambulatory Visit: Payer: Self-pay | Admitting: Urology

## 2016-10-05 ENCOUNTER — Other Ambulatory Visit: Payer: Self-pay | Admitting: Family Medicine

## 2016-11-06 ENCOUNTER — Encounter: Payer: Self-pay | Admitting: Family Medicine

## 2016-11-06 ENCOUNTER — Ambulatory Visit (INDEPENDENT_AMBULATORY_CARE_PROVIDER_SITE_OTHER): Payer: BLUE CROSS/BLUE SHIELD | Admitting: Family Medicine

## 2016-11-06 VITALS — BP 122/76 | HR 79 | Temp 97.9°F | Ht 70.5 in | Wt 192.2 lb

## 2016-11-06 DIAGNOSIS — Z8589 Personal history of malignant neoplasm of other organs and systems: Secondary | ICD-10-CM

## 2016-11-06 DIAGNOSIS — Z0000 Encounter for general adult medical examination without abnormal findings: Secondary | ICD-10-CM | POA: Diagnosis not present

## 2016-11-06 DIAGNOSIS — C7989 Secondary malignant neoplasm of other specified sites: Secondary | ICD-10-CM | POA: Diagnosis not present

## 2016-11-06 DIAGNOSIS — E7800 Pure hypercholesterolemia, unspecified: Secondary | ICD-10-CM

## 2016-11-06 DIAGNOSIS — R739 Hyperglycemia, unspecified: Secondary | ICD-10-CM | POA: Diagnosis not present

## 2016-11-06 DIAGNOSIS — N138 Other obstructive and reflux uropathy: Secondary | ICD-10-CM | POA: Diagnosis not present

## 2016-11-06 DIAGNOSIS — Z23 Encounter for immunization: Secondary | ICD-10-CM

## 2016-11-06 DIAGNOSIS — C801 Malignant (primary) neoplasm, unspecified: Secondary | ICD-10-CM | POA: Diagnosis not present

## 2016-11-06 DIAGNOSIS — I10 Essential (primary) hypertension: Secondary | ICD-10-CM

## 2016-11-06 DIAGNOSIS — N401 Enlarged prostate with lower urinary tract symptoms: Secondary | ICD-10-CM | POA: Diagnosis not present

## 2016-11-06 MED ORDER — MELOXICAM 15 MG PO TABS: 15.0000 mg | ORAL_TABLET | Freq: Every day | ORAL | 1 refills | Status: DC | PRN

## 2016-11-06 MED ORDER — ENALAPRIL MALEATE 10 MG PO TABS: 10.0000 mg | ORAL_TABLET | Freq: Every day | ORAL | 11 refills | Status: DC

## 2016-11-06 MED ORDER — HYDROCHLOROTHIAZIDE 25 MG PO TABS: 25.0000 mg | ORAL_TABLET | Freq: Every day | ORAL | 11 refills | Status: DC

## 2016-11-06 NOTE — Assessment & Plan Note (Signed)
Resolved Close derm f/u

## 2016-11-06 NOTE — Assessment & Plan Note (Signed)
A1C today  disc imp of low glycemic diet and wt loss to prevent DM2  

## 2016-11-06 NOTE — Assessment & Plan Note (Signed)
Labs today  Disc goals for lipids and reasons to control them Rev labs with pt (from prior draw) Rev low sat fat diet in detail  Intolerant of statins fam hx of CAD

## 2016-11-06 NOTE — Assessment & Plan Note (Signed)
Pt will make a f/u appt with urology  No medicines-states symptoms have improved on their own  Due for DRE and PSA

## 2016-11-06 NOTE — Patient Instructions (Addendum)
If shoulder does not improve make an appt here with Dr Lorelei Pont   Tdap vaccine today   Don't forget to schedule follow up with your urologist for psa and follow up  Labs today

## 2016-11-06 NOTE — Assessment & Plan Note (Signed)
Reviewed health habits including diet and exercise and skin cancer prevention Reviewed appropriate screening tests for age  Also reviewed health mt list, fam hx and immunization status , as well as social and family history   See HPI Labs drawn today for wellness Tdap today

## 2016-11-06 NOTE — Progress Notes (Signed)
Subjective:    Patient ID: Terry Lowe, male    DOB: 1955/03/31, 61 y.o.   MRN: [id number]  HPI  Here for health maintenance exam and to review chronic medical problems    Doing well except for his shoulder  R shoulder hurts  No injury "burning twinge" Does heavy lifting  Took tylenol  Some ice  Some motrin - 2 pills    Wt Readings from Last 3 Encounters:  11/06/16 192 lb 4 oz (87.2 kg)  11/22/15 200 lb (90.7 kg)  08/30/15 198 lb 8 oz (90 kg)  working hard /sweating a lot  Diet is ok - tries not to eat junk  Does not eat breakfast and does not eat until the end of the day  He does eat nuts as a snack  27.20 kg/m  Tetanus shot 4/10- but needs Tdap due to new baby in the family    Colonoscopy 8/17 -polyps (adenoma) Dr Tiffany Kocher- 5 y recall   Former smoker-quit years ago   Personal hx of squamous cell skin cancer fam hx of melanoma  Just had a visit and a lot of AKS were "zapped"  Had a pre-melanoma removed a year ago  Using spf 75-100 on his face and body    bp is stable today  No cp or palpitations or headaches or edema  No side effects to medicines  BP Readings from Last 3 Encounters:  11/06/16 122/76  11/22/15 115/73  08/30/15 138/82    On enalapril and hctz   No cp or sob   Hx of testosterone def  Used to be on clomid   Hx of BPH- due for a urology visit  Urology f/u -is due  He will make his own appt  No prostate symptoms No nocturia  No medicines at all right now   Hx of elevated blood sugar Due for labs  Hx of hyperlipidemia Lab Results  Component Value Date   CHOL 178 08/30/2015   HDL 39.50 08/30/2015   LDLCALC 115 (H) 08/30/2015   LDLDIRECT 133.7 09/28/2009   TRIG 117.0 08/30/2015   CHOLHDL 5 08/30/2015   Intol of statins  Had a cath in [facility] to cardiology and he said "I was fine"  Patient Active Problem List   Diagnosis Date Noted  . Hyperglycemia 08/31/2015  . Colon cancer screening 08/30/2015  . Routine general  medical examination at a health care facility 08/07/2015  . Erectile dysfunction of organic origin 02/09/2015  . BPH with obstruction/lower urinary tract symptoms 02/09/2015  . Hypogonadism in male 02/08/2015  . Allergic rhinitis 08/21/2014  . History of squamous cell carcinoma 09/28/2009  . Hyperlipidemia 06/03/2007  . 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 TESTOSTERONE DEFICIENCY 06/25/2006  . Essential hypertension 06/25/2006  . CAD 06/25/2006   Past Medical History:  Diagnosis Date  . BPH (benign prostatic hyperplasia)   . CAD (coronary artery disease)   . Erectile dysfunction   . HTN (hypertension)   . Hyperlipidemia   . Hypogonadism in male   . Low testosterone    with replacement  . Squamous cell carcinoma    neck mass- surgery- radical neck dissection/radiation and chemo  . Varicose vein    L lower leg- pain   Past Surgical History:  Procedure Laterality Date  . Cath (other)  2007   hospital for chest pain  . COLONOSCOPY WITH PROPOFOL N/A 11/22/2015   Procedure: COLONOSCOPY WITH PROPOFOL;  Surgeon: Manya Silvas, MD;  Location: [facility] ENDOSCOPY;  Service: Endoscopy;  Laterality: N/A;  . CT of chest  9/10   atelectasis  . CT of neck  9/10   enlarged lymph node  . LN R neck  9/10   inconclusive fine needle bx  . squamous cell carcinoma/metastatic     foundin neck 10/10 radical neck dissection  . TSA  1960s   Social History  Substance Use Topics  . Smoking status: Former Research scientist (life sciences)  . Smokeless tobacco: Never Used     Comment: quit 20 years ago  . Alcohol use 0.0 oz/week     Comment: 1-2 beers per day occas   Family History  Problem Relation Age of Onset  . Coronary artery disease Father   . Melanoma Father   . Heart disease Father        CAD  . Cancer Father        melanoma  . Breast cancer Mother    Allergies  Allergen Reactions  . Pneumococcal Vaccine Polyvalent     REACTION: itching for over a year  . Statins     REACTION: myalgia severe   Current Outpatient  Prescriptions on File Prior to Visit  Medication Sig Dispense Refill  . aspirin (BAYER ASPIRIN EC LOW DOSE) 81 MG EC tablet Take 81 mg by mouth daily.      . sildenafil (VIAGRA) 100 MG tablet Take 1 tablet (100 mg total) by mouth daily as needed for erectile dysfunction. 10 tablet 12  . vitamin C (ASCORBIC ACID) 500 MG tablet Take 500 mg by mouth daily.     No current facility-administered medications on file prior to visit.       Review of Systems Review of Systems  Constitutional: Negative for fever, appetite change, fatigue and unexpected weight change.  Eyes: Negative for pain and visual disturbance.  Respiratory: Negative for cough and shortness of breath.   Cardiovascular: Negative for cp or palpitations    Gastrointestinal: Negative for nausea, diarrhea and constipation.  Genitourinary: Negative for urgency and frequency.  Skin: Negative for pallor or rash  MSk pos for R shoulder pain   Neurological: Negative for weakness, light-headedness, numbness and headaches.  Hematological: Negative for adenopathy. Does not bruise/bleed easily.  Psychiatric/Behavioral: Negative for dysphoric mood. The patient is not nervous/anxious.         Objective:   Physical Exam  Constitutional: He appears well-developed and well-nourished. No distress.  Well appearing   HENT:  Head: Normocephalic and atraumatic.  Right Ear: External ear normal.  Left Ear: External ear normal.  Nose: Nose normal.  Mouth/Throat: Oropharynx is clear and moist.  Eyes: Pupils are equal, round, and reactive to light. Conjunctivae and EOM are normal. Right eye exhibits no discharge. Left eye exhibits no discharge. No scleral icterus.  Neck: Normal range of motion. Neck supple. No JVD present. Carotid bruit is not present. No thyromegaly present.  Cardiovascular: Normal rate, regular rhythm, normal heart sounds and intact distal pulses.  Exam reveals no gallop.   Pulmonary/Chest: Effort normal and breath sounds  normal. No respiratory distress. He has no wheezes. He exhibits no tenderness.  Abdominal: Soft. Bowel sounds are normal. He exhibits no distension, no abdominal bruit and no mass. There is no tenderness.  Musculoskeletal: He exhibits no edema or tenderness.  Lymphadenopathy:    He has no cervical adenopathy.  Neurological: He is alert. He has normal reflexes. No cranial nerve deficit. He exhibits normal muscle tone. Coordination normal.  Skin: Skin is warm and dry. No rash noted.  No erythema. No pallor.  Solar lentigines diffusely  Tanned    Scars from past radical neck dissection noted  Psychiatric: He has a normal mood and affect.          Assessment & Plan:   Problem List Items Addressed This Visit      Cardiovascular and Mediastinum   Essential hypertension - Primary    bp in fair control at this time  BP Readings from Last 1 Encounters:  11/06/16 122/76   No changes needed Disc lifstyle change with low sodium diet and exercise  Labs today        Relevant Medications   enalapril (VASOTEC) 10 MG tablet   hydrochlorothiazide (HYDRODIURIL) 25 MG tablet   Other Relevant Orders   Lipid panel   Comprehensive metabolic panel   CBC with Differential/Platelet   TSH     Genitourinary   BPH with obstruction/lower urinary tract symptoms    Pt will make a f/u appt with urology  No medicines-states symptoms have improved on their own  Due for DRE and PSA         Other   History of squamous cell carcinoma    Continues close f/u with dermatology      Hyperglycemia    A1C today disc imp of low glycemic diet and wt loss to prevent DM2       Relevant Orders   Hemoglobin A1c   Hyperlipidemia    Labs today  Disc goals for lipids and reasons to control them Rev labs with pt (from prior draw) Rev low sat fat diet in detail  Intolerant of statins fam hx of CAD      Relevant Medications   enalapril (VASOTEC) 10 MG tablet   hydrochlorothiazide (HYDRODIURIL) 25 MG  tablet   Routine general medical examination at a health care facility    Reviewed health habits including diet and exercise and skin cancer prevention Reviewed appropriate screening tests for age  Also reviewed health mt list, fam hx and immunization status , as well as social and family history   See HPI Labs drawn today for wellness Tdap today       RESOLVED: Squamous cell carcinoma metastatic to head and neck with unknown primary site [facility])    Resolved Close derm f/u       Other Visit Diagnoses    Need for Tdap vaccination       Relevant Orders   Tdap vaccine greater than or equal to 7yo IM (Completed)

## 2016-11-06 NOTE — Assessment & Plan Note (Signed)
Continues close f/u with dermatology

## 2016-11-06 NOTE — Assessment & Plan Note (Signed)
bp in fair control at this time  BP Readings from Last 1 Encounters:  11/06/16 122/76   No changes needed Disc lifstyle change with low sodium diet and exercise  Labs today

## 2016-11-07 LAB — COMPREHENSIVE METABOLIC PANEL
ALBUMIN: 4.6 g/dL (ref 3.5–5.2)
ALT: 31 U/L (ref 0–53)
AST: 27 U/L (ref 0–37)
Alkaline Phosphatase: 62 U/L (ref 39–117)
BUN: 17 mg/dL (ref 6–23)
CALCIUM: 9.4 mg/dL (ref 8.4–10.5)
CHLORIDE: 105 meq/L (ref 96–112)
CO2: 30 mEq/L (ref 19–32)
Creatinine, Ser: 0.89 mg/dL (ref 0.40–1.50)
GFR: 92.76 mL/min (ref >60.00)
Glucose, Bld: 96 mg/dL (ref 70–99)
POTASSIUM: 4 meq/L (ref 3.5–5.1)
Sodium: 142 mEq/L (ref 135–145)
Total Bilirubin: 0.8 mg/dL (ref 0.2–1.2)
Total Protein: 6.4 g/dL (ref 6.0–8.3)

## 2016-11-07 LAB — CBC WITH DIFFERENTIAL/PLATELET
Basophils Absolute: 0 10*3/uL (ref 0.0–0.1)
Basophils Relative: 0.5 % (ref 0.0–3.0)
EOS ABS: 0.1 10*3/uL (ref 0.0–0.7)
EOS PCT: 1.4 % (ref 0.0–5.0)
HCT: 47.9 % (ref 39.0–52.0)
HEMOGLOBIN: 16.2 g/dL (ref 13.0–17.0)
LYMPHS PCT: 21.9 % (ref 12.0–46.0)
Lymphs Abs: 1.1 10*3/uL (ref 0.7–4.0)
MCHC: 33.8 g/dL (ref 30.0–36.0)
MCV: 91.5 fl (ref 78.0–100.0)
Monocytes Absolute: 0.5 10*3/uL (ref 0.1–1.0)
Monocytes Relative: 9.1 % (ref 3.0–12.0)
Neutro Abs: 3.3 10*3/uL (ref 1.4–7.7)
Neutrophils Relative %: 67.1 % (ref 43.0–77.0)
Platelets: 186 10*3/uL (ref 150.0–400.0)
RBC: 5.23 Mil/uL (ref 4.22–5.81)
RDW: 12.4 % (ref 11.5–15.5)
WBC: 5 10*3/uL (ref 4.0–10.5)

## 2016-11-07 LAB — TSH: TSH: 2.44 u[IU]/mL (ref 0.35–4.50)

## 2016-11-07 LAB — LIPID PANEL
CHOLESTEROL: 186 mg/dL (ref 0–200)
HDL: 42.3 mg/dL (ref >39.00)
LDL Cholesterol: 118 mg/dL — ABNORMAL HIGH (ref 0–99)
NONHDL: 143.59
Total CHOL/HDL Ratio: 4
Triglycerides: 126 mg/dL (ref 0.0–149.0)
VLDL: 25.2 mg/dL (ref 0.0–40.0)

## 2016-11-07 LAB — HEMOGLOBIN A1C: Hgb A1c MFr Bld: 5.9 % (ref 4.6–6.5)

## 2016-11-07 MED ORDER — ENALAPRIL MALEATE 10 MG PO TABS: 10.0000 mg | ORAL_TABLET | Freq: Every day | ORAL | 11 refills | Status: DC

## 2016-11-07 MED ORDER — MELOXICAM 15 MG PO TABS: 15.0000 mg | ORAL_TABLET | Freq: Every day | ORAL | 1 refills | Status: DC | PRN

## 2016-11-07 MED ORDER — HYDROCHLOROTHIAZIDE 25 MG PO TABS: 25.0000 mg | ORAL_TABLET | Freq: Every day | ORAL | 11 refills | Status: DC

## 2016-11-07 NOTE — Addendum Note (Signed)
Addended by: Tammi Sou on: 11/07/2016 11:04 AM   Modules accepted: Orders

## 2016-11-08 ENCOUNTER — Encounter: Payer: Self-pay | Admitting: *Deleted

## 2016-12-25 ENCOUNTER — Telehealth: Payer: Self-pay | Admitting: Family Medicine

## 2016-12-25 NOTE — Telephone Encounter (Signed)
Left voicemail requesting pt to call the office back and schedule a f/u appt and update [place] on how he's doing

## 2016-12-25 NOTE — Telephone Encounter (Signed)
Call from Dr Verner Chol- dermatology  Pt is having Moh's procedure  bp was quite high 190/115- very high for him (here  BP Readings from Last 3 Encounters:  11/06/16 122/76  11/22/15 115/73  08/30/15 138/82  ) They gave him a lorazepam thinking it was anxiety from the procedure  Wanted [place] to follow up   They went ahead to finish the procedure  Please call pt to check on him and schedule a f/u for bp check here

## 2016-12-27 ENCOUNTER — Encounter: Payer: Self-pay | Admitting: Family Medicine

## 2016-12-27 ENCOUNTER — Ambulatory Visit (INDEPENDENT_AMBULATORY_CARE_PROVIDER_SITE_OTHER): Payer: BLUE CROSS/BLUE SHIELD | Admitting: Family Medicine

## 2016-12-27 VITALS — BP 158/98 | HR 94 | Temp 98.4°F | Ht 70.5 in | Wt 194.1 lb

## 2016-12-27 DIAGNOSIS — I10 Essential (primary) hypertension: Secondary | ICD-10-CM | POA: Diagnosis not present

## 2016-12-27 DIAGNOSIS — M25511 Pain in right shoulder: Secondary | ICD-10-CM

## 2016-12-27 DIAGNOSIS — G8929 Other chronic pain: Secondary | ICD-10-CM | POA: Diagnosis not present

## 2016-12-27 MED ORDER — ENALAPRIL MALEATE 20 MG PO TABS: 20.0000 mg | ORAL_TABLET | Freq: Every day | ORAL | 11 refills | Status: DC

## 2016-12-27 NOTE — Telephone Encounter (Signed)
Pt scheduled an appt for this afternoon

## 2016-12-27 NOTE — Progress Notes (Signed)
Subjective:    Patient ID: Terry Lowe, male    DOB: 04/13/1955, 61 y.o.   MRN: [id number]  HPI  Here for f/u of elevated bp after Moh's surgery for skin cancer   Wt Readings from Last 3 Encounters:  12/27/16 194 lb 1.9 oz (88.1 kg)  11/06/16 192 lb 4 oz (87.2 kg)  11/22/15 200 lb (90.7 kg)   BP Readings from Last 3 Encounters:  12/27/16 (!) 166/92  11/06/16 122/76  11/22/15 115/73    In the past he has felt much better with systolic bp over 397   (dizzy if it is lower)  tkes hctz 25 mg daily  Enalapril 10 mg daily   A mild headache Nose is a little stuffy  Did not take any sudafed  Did use flonase    Also having some R shoulder pain  Used a lanacaine patch otc for shoulder pain   ? If he pulled something  Sore right at the top= and it radiates down to elbow  2 y ago -had an injury but it got better (throwing a baseball)  Does a lot of lifting (drums and 5 gallon pails daily)    Not anxious  Some pain in shoulder- tolerable  More active - walking more and push mowing  Wt is down 6 lb  Very little salty foods  Takes meloxicam when shoulder gets bad - not today and not the day of the surgery  Did drink a cup of coffee yesterday    Patient Active Problem List   Diagnosis Date Noted  . Right shoulder pain 12/28/2016  . Hyperglycemia 08/31/2015  . Colon cancer screening 08/30/2015  . Routine general medical examination at a health care facility 08/07/2015  . Erectile dysfunction of organic origin 02/09/2015  . BPH with obstruction/lower urinary tract symptoms 02/09/2015  . Hypogonadism in male 02/08/2015  . Allergic rhinitis 08/21/2014  . History of squamous cell carcinoma 09/28/2009  . Hyperlipidemia 06/03/2007  . 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 TESTOSTERONE DEFICIENCY 06/25/2006  . Essential hypertension 06/25/2006  . CAD 06/25/2006   Past Medical History:  Diagnosis Date  . BPH (benign prostatic hyperplasia)   . CAD (coronary artery disease)   . Erectile  dysfunction   . HTN (hypertension)   . Hyperlipidemia   . Hypogonadism in male   . Low testosterone    with replacement  . Squamous cell carcinoma    neck mass- surgery- radical neck dissection/radiation and chemo  . Varicose vein    L lower leg- pain   Past Surgical History:  Procedure Laterality Date  . Cath (other)  2007   hospital for chest pain  . COLONOSCOPY WITH PROPOFOL N/A 11/22/2015   Procedure: COLONOSCOPY WITH PROPOFOL;  Surgeon: Manya Silvas, MD;  Location: [facility] ENDOSCOPY;  Service: Endoscopy;  Laterality: N/A;  . CT of chest  9/10   atelectasis  . CT of neck  9/10   enlarged lymph node  . LN R neck  9/10   inconclusive fine needle bx  . squamous cell carcinoma/metastatic     foundin neck 10/10 radical neck dissection  . TSA  1960s   Social History  Substance Use Topics  . Smoking status: Former Research scientist (life sciences)  . Smokeless tobacco: Never Used     Comment: quit 20 years ago  . Alcohol use 0.0 oz/week     Comment: 1-2 beers per day occas   Family History  Problem Relation Age of Onset  . Coronary artery disease  Father   . Melanoma Father   . Heart disease Father        CAD  . Cancer Father        melanoma  . Breast cancer Mother    Allergies  Allergen Reactions  . Pneumococcal Vaccine Polyvalent     REACTION: itching for over a year  . Statins     REACTION: myalgia severe   Current Outpatient Prescriptions on File Prior to Visit  Medication Sig Dispense Refill  . aspirin (BAYER ASPIRIN EC LOW DOSE) 81 MG EC tablet Take 81 mg by mouth daily.      . hydrochlorothiazide (HYDRODIURIL) 25 MG tablet Take 1 tablet (25 mg total) by mouth daily. 30 tablet 11  . meloxicam (MOBIC) 15 MG tablet Take 1 tablet (15 mg total) by mouth daily as needed for pain. With food as needed for shoulder pain 30 tablet 1  . sildenafil (VIAGRA) 100 MG tablet Take 1 tablet (100 mg total) by mouth daily as needed for erectile dysfunction. 10 tablet 12  . vitamin C (ASCORBIC ACID) 500  MG tablet Take 500 mg by mouth daily.     No current facility-administered medications on file prior to visit.     Review of Systems  Constitutional: Negative for activity change, appetite change, fatigue, fever and unexpected weight change.  HENT: Negative for congestion, rhinorrhea, sore throat and trouble swallowing.   Eyes: Negative for pain, redness, itching and visual disturbance.  Respiratory: Negative for cough, chest tightness, shortness of breath and wheezing.   Cardiovascular: Negative for chest pain and palpitations.  Gastrointestinal: Negative for abdominal pain, blood in stool, constipation, diarrhea and nausea.  Endocrine: Negative for cold intolerance, heat intolerance, polydipsia and polyuria.  Genitourinary: Negative for difficulty urinating, dysuria, frequency and urgency.  Musculoskeletal: Positive for arthralgias. Negative for back pain, joint swelling and myalgias.  Skin: Negative for pallor and rash.       Pos for post op site on L ear from Moh's surgery that is healing  Neurological: Negative for dizziness, tremors, weakness, numbness and headaches.  Hematological: Negative for adenopathy. Does not bruise/bleed easily.  Psychiatric/Behavioral: Negative for decreased concentration and dysphoric mood. The patient is not nervous/anxious.        Objective:   Physical Exam  Constitutional: He appears well-developed and well-nourished. No distress.  Well appearing   HENT:  Head: Normocephalic and atraumatic.  Mouth/Throat: Oropharynx is clear and moist.  Eyes: Pupils are equal, round, and reactive to light. Conjunctivae and EOM are normal.  Neck: Normal range of motion. Neck supple. No JVD present. Carotid bruit is not present. No thyromegaly present.  Cardiovascular: Normal rate, regular rhythm, normal heart sounds and intact distal pulses.  Exam reveals no gallop.   Pulmonary/Chest: Effort normal and breath sounds normal. No respiratory distress. He has no wheezes.  He has no rales.  No crackles  Abdominal: Soft. Bowel sounds are normal. He exhibits no distension, no abdominal bruit and no mass. There is no tenderness.  Musculoskeletal: He exhibits no edema.  Shoulder right: No deformity/swelling/warmth or erythema  No crepitus  No obvious effusion  Abduction to 90 degrees (then pain) Hawking test pos for ant pain Neer test pos for ant pain  Internal rotation normal External rotation -discomfort  Tenderness -over acromion and bicep tendon Normal grip and hand dexterity     Lymphadenopathy:    He has no cervical adenopathy.  Neurological: He is alert. He has normal reflexes. He displays no atrophy. No  cranial nerve deficit or sensory deficit. He exhibits normal muscle tone. Coordination normal.  Skin: Skin is warm and dry. No rash noted. No pallor.  Bandage on L ear   Psychiatric: He has a normal mood and affect.          Assessment & Plan:   Problem List Items Addressed This Visit      Cardiovascular and Mediastinum   Essential hypertension - Primary    BP is improved from his surgery  BP: (!) 158/98   still needs better control Aware he feels dizzy and weak if systolic goes below 357 - have struggled with that in the past Disc lifestyle change Disc options for tx  Will remain on hctz 25 mg  Inc enalapril to 20 mg daily (disc poss side eff) He will watch bp at home and call Monday with readings - then plan from there  Handouts given       Relevant Medications   enalapril (VASOTEC) 20 MG tablet     Other   Right shoulder pain    Acute on chronic  On and off since injury from lifting 2 y ago  Sup/ant pain - with limited abd and ext rotation  Suspect tendonitis  Recommend ice/relative rest  rom exercises/consider PT  Xray and sport med consult if not improved

## 2016-12-27 NOTE — Patient Instructions (Addendum)
Continue the hctz 25 mg once daily  Increase the enalapril to 20 mg once daily (2 pills of the 10 mg) and the new px will be for 20 mg pills - one a day  Keep watching sodium  Keep drinking water  Stay active   Let me know next week how your blood pressure is  Goal is to get you to 140/90 or below

## 2016-12-28 DIAGNOSIS — M25511 Pain in right shoulder: Secondary | ICD-10-CM | POA: Insufficient documentation

## 2016-12-28 NOTE — Assessment & Plan Note (Signed)
BP is improved from his surgery  BP: (!) 158/98   still needs better control Aware he feels dizzy and weak if systolic goes below 037 - have struggled with that in the past Disc lifestyle change Disc options for tx  Will remain on hctz 25 mg  Inc enalapril to 20 mg daily (disc poss side eff) He will watch bp at home and call Monday with readings - then plan from there  Handouts given

## 2016-12-28 NOTE — Assessment & Plan Note (Signed)
Acute on chronic  On and off since injury from lifting 2 y ago  Sup/ant pain - with limited abd and ext rotation  Suspect tendonitis  Recommend ice/relative rest  rom exercises/consider PT  Xray and sport med consult if not improved

## 2017-01-19 ENCOUNTER — Telehealth: Payer: Self-pay | Admitting: *Deleted

## 2017-01-19 ENCOUNTER — Encounter: Payer: Self-pay | Admitting: Emergency Medicine

## 2017-01-19 ENCOUNTER — Emergency Department
Admission: EM | Admit: 2017-01-19 | Discharge: 2017-01-19 | Disposition: A | Discharge status: Home / Self Care | Payer: BLUE CROSS/BLUE SHIELD | Attending: Emergency Medicine | Admitting: Emergency Medicine

## 2017-01-19 ENCOUNTER — Ambulatory Visit: Payer: Self-pay | Admitting: *Deleted

## 2017-01-19 DIAGNOSIS — I10 Essential (primary) hypertension: Secondary | ICD-10-CM | POA: Diagnosis present

## 2017-01-19 DIAGNOSIS — I2510 Atherosclerotic heart disease of native coronary artery without angina pectoris: Secondary | ICD-10-CM | POA: Diagnosis not present

## 2017-01-19 DIAGNOSIS — Z87891 Personal history of nicotine dependence: Secondary | ICD-10-CM | POA: Insufficient documentation

## 2017-01-19 DIAGNOSIS — Z85828 Personal history of other malignant neoplasm of skin: Secondary | ICD-10-CM | POA: Insufficient documentation

## 2017-01-19 DIAGNOSIS — Z79899 Other long term (current) drug therapy: Secondary | ICD-10-CM | POA: Insufficient documentation

## 2017-01-19 DIAGNOSIS — Z85038 Personal history of other malignant neoplasm of large intestine: Secondary | ICD-10-CM | POA: Insufficient documentation

## 2017-01-19 DIAGNOSIS — Z7982 Long term (current) use of aspirin: Secondary | ICD-10-CM | POA: Insufficient documentation

## 2017-01-19 MED ORDER — METOPROLOL SUCCINATE ER 25 MG PO TB24: 25.0000 mg | ORAL_TABLET | Freq: Every day | ORAL | 0 refills | Status: DC

## 2017-01-19 NOTE — ED Notes (Addendum)
ED Provider at bedside. PT states PCP changed BP meds. Pt is A/O and siding at bedside with wife. RN will monitor.

## 2017-01-19 NOTE — ED Provider Notes (Signed)
[facility] Emergency Department Provider Note  ____________________________________________   First MD Initiated Contact with Patient 01/19/17 [phone]     (approximate)  I have reviewed the triage vital signs and the nursing notes.   HISTORY  Chief Complaint Hypertension   HPI Terry Lowe is a 61 y.o. male who presents to the emergency department with several days of asymptomatic hypertension. He has a long-standing history of hypertension and has been taking 25 mg of hydrochlorothiazide along with 10 mg of enalapril for quite some time. Roughly 3 weeks ago his primary care increased his enalapril to 20 mg a day. He had done well until about the last 4-5 days and he has noted his blood pressure has been around 200 at home. This morning it was 220/120 so she called his primary care physician who advised him to come to the emergency department. He currently denies headache, chest pain, shortness of breath, double vision, blurred vision, numbness, weakness.   Past Medical History:  Diagnosis Date  . BPH (benign prostatic hyperplasia)   . CAD (coronary artery disease)   . Erectile dysfunction   . HTN (hypertension)   . Hyperlipidemia   . Hypogonadism in male   . Low testosterone    with replacement  . Squamous cell carcinoma    neck mass- surgery- radical neck dissection/radiation and chemo  . Varicose vein    L lower leg- pain    Patient Active Problem List   Diagnosis Date Noted  . Right shoulder pain 12/28/2016  . Hyperglycemia 08/31/2015  . Colon cancer screening 08/30/2015  . Routine general medical examination at a health care facility 08/07/2015  . Erectile dysfunction of organic origin 02/09/2015  . BPH with obstruction/lower urinary tract symptoms 02/09/2015  . Hypogonadism in male 02/08/2015  . Allergic rhinitis 08/21/2014  . History of squamous cell carcinoma 09/28/2009  . Hyperlipidemia 06/03/2007  . 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  TESTOSTERONE DEFICIENCY 06/25/2006  . Essential hypertension 06/25/2006  . CAD 06/25/2006    Past Surgical History:  Procedure Laterality Date  . Cath (other)  2007   hospital for chest pain  . COLONOSCOPY WITH PROPOFOL N/A 11/22/2015   Procedure: COLONOSCOPY WITH PROPOFOL;  Surgeon: Manya Silvas, MD;  Location: [facility] ENDOSCOPY;  Service: Endoscopy;  Laterality: N/A;  . CT of chest  9/10   atelectasis  . CT of neck  9/10   enlarged lymph node  . LN R neck  9/10   inconclusive fine needle bx  . squamous cell carcinoma/metastatic     foundin neck 10/10 radical neck dissection  . TSA  1960s    Prior to Admission medications   Medication Sig Start Date End Date Taking? Authorizing Provider  aspirin (BAYER ASPIRIN EC LOW DOSE) 81 MG EC tablet Take 81 mg by mouth daily.      [provider]  cephALEXin (KEFLEX) 500 MG capsule Take 500 mg by mouth 2 (two) times daily.  12/25/16   [provider]  enalapril (VASOTEC) 20 MG tablet Take 1 tablet (20 mg total) by mouth daily. 12/27/16   Tower, Wynelle Fanny, MD  hydrochlorothiazide (HYDRODIURIL) 25 MG tablet Take 1 tablet (25 mg total) by mouth daily. 11/07/16   Tower, Wynelle Fanny, MD  meloxicam (MOBIC) 15 MG tablet Take 1 tablet (15 mg total) by mouth daily as needed for pain. With food as needed for shoulder pain 11/07/16   Tower, Wynelle Fanny, MD  sildenafil (VIAGRA) 100 MG tablet Take 1  tablet (100 mg total) by mouth daily as needed for erectile dysfunction. 02/08/15   Zara Council A, PA-C  vitamin C (ASCORBIC ACID) 500 MG tablet Take 500 mg by mouth daily.    [provider]    Allergies Pneumococcal vaccine polyvalent and Statins  Family History  Problem Relation Age of Onset  . Coronary artery disease Father   . Melanoma Father   . Heart disease Father        CAD  . Cancer Father        melanoma  . Breast cancer Mother     Social History Social History  Substance Use Topics  . Smoking status: Former Research scientist (life sciences)    . Smokeless tobacco: Never Used     Comment: quit 20 years ago  . Alcohol use 0.0 oz/week     Comment: 1-2 beers per day occas    Review of Systems Constitutional: No fever/chills ENT: No sore throat. Cardiovascular: Denies chest pain. Respiratory: Denies shortness of breath. Gastrointestinal: No abdominal pain.  No nausea, no vomiting.  No diarrhea.  No constipation. Musculoskeletal: Negative for back pain. Neurological: Negative for headaches   ____________________________________________   PHYSICAL EXAM:  VITAL SIGNS: ED Triage Vitals  Enc Vitals Group     BP 01/19/17 0931 (!) 193/105     Pulse Rate 01/19/17 0931 (!) 101     Resp 01/19/17 0931 20     Temp 01/19/17 0931 98.8 F (37.1 C)     Temp Source 01/19/17 0931 Oral     SpO2 01/19/17 0931 97 %     Weight 01/19/17 0932 194 lb 6 oz (88.2 kg)     Height 01/19/17 0932 6' (1.829 m)     Head Circumference --      Peak Flow --      Pain Score --      Pain Loc --      Pain Edu? --      Excl. in [place]? --     Constitutional: Alert and oriented 4 anxious appearing nontoxic no diaphoresis speaks in full clear sentences Head: Atraumatic. Nose: No congestion/rhinnorhea. Mouth/Throat: No trismus Neck: No stridor.   Cardiovascular: Tachycardic regular rhythm Respiratory: Normal respiratory effort.  No retractions. Gastrointestinal: Soft nontender Neurologic:  Normal speech and language. No gross focal neurologic deficits are appreciated.  Skin:  Skin is warm, dry and intact. No rash noted.    ____________________________________________  LABS (all labs ordered are listed, but only abnormal results are displayed)  Labs Reviewed - No data to display   __________________________________________  EKG   ____________________________________________  RADIOLOGY   ____________________________________________   DIFFERENTIAL includes but not limited to  Asymptomatic hypertension, hypertensive urgency,  hypertensive emergency   PROCEDURES  Procedure(s) performed: no  Procedures  Critical Care performed: no  Observation: no ____________________________________________   INITIAL IMPRESSION / ASSESSMENT AND PLAN / ED COURSE  Pertinent labs & imaging results that were available during my care of the patient were reviewed by me and considered in my medical decision making (see chart for details).  The patient arrives with elevated blood pressure, however asymptomatic with a normal exam. I will reach out to his primary care physician Dr. Glori Bickers now regarding adding on a third line blood pressure agent.     ----------------------------------------- 10:14 AM on 01/19/2017 -----------------------------------------  I discussed the case with the patient's primary care physician Dr. Glori Bickers who agrees with the initiation of metoprolol 25 mg extended release once a day and she will follow-up  with him this coming week for reevaluation. Strict return precautions given and the patient verbalizes understanding and agreement with the plan. ____________________________________________   FINAL CLINICAL IMPRESSION(S) / ED DIAGNOSES  Final diagnoses:  Hypertension, unspecified type      NEW MEDICATIONS STARTED DURING THIS VISIT:  New Prescriptions   No medications on file     Note:  This document was prepared using Dragon voice recognition software and may include unintentional dictation errors.      Darel Hong, MD 01/19/17 1014

## 2017-01-19 NOTE — Telephone Encounter (Signed)
Copied from [place]. Topic: General - Other >> Jan 19, 2017  9:50 AM Ahmed Prima L wrote: Reason for CRM:  Dr Tressia Miners wants to speak with Terry Lowe. Patient is at the hospital now with elevated BP. Called backdoor line & could not get anyone. Please have her call [phone]  I spoke to him bp is up in ED Adding metoprolol xl 25   [place]-please schedule f/u with me next week if not already done Thanks

## 2017-01-19 NOTE — ED Triage Notes (Signed)
Pt reports BP has been elevated last 2 days as high as 220/120.  MD increased his enalapril last week to 20 mg. Pt has had this and his 25 mg HCTZ today. Denies CP/SHOB/vision changes or other symptoms related to elevated BP.

## 2017-01-19 NOTE — Discharge Instructions (Signed)
Please begin taking your new blood pressure medicine once a day as prescribed and make a follow-up appointment to see her primary care physician this coming Monday for reexamination.  Return to the emergency department sooner for any concerns whatsoever.

## 2017-01-19 NOTE — Telephone Encounter (Signed)
   Reason for Disposition . [4] Systolic BP  >= 888 OR Diastolic >= 916 AND [9] cardiac or neurologic symptoms (e.g., chest pain, difficulty breathing, unsteady gait, blurred vision)  Answer Assessment - Initial Assessment Questions 1. BLOOD PRESSURE: "What is the blood pressure?" "Did you take at least two measurements 5 minutes apart?"     211/104 at 6:30 this morning and  201/120 2. ONSET: "When did you take your blood pressure?"     Right now 3. HOW: "How did you obtain the blood pressure?" (e.g., visiting nurse, automatic home BP monitor)     Automatic home B/P 4. HISTORY: "Do you have a history of high blood pressure?"     yes 5. MEDICATIONS: "Are you taking any medications for blood pressure?" "Have you missed any doses recently?"     yes 6. OTHER SYMPTOMS: "Do you have any symptoms?" (e.g., headache, chest pain, blurred vision, difficulty breathing, weakness)     Dull headache 7. PREGNANCY: "Is there any chance you are pregnant?" "When was your last menstrual period?"     n/a  Protocols used: HIGH BLOOD PRESSURE-A-AH  Pt states he has been having problems with getting his B/P down for the last 36 hours. Stated that he has a dull headache. He took his b/p meds and was wondering if he should "double up". Pt advised to go to the ED and let the medical personal give him med if needed and monitor his b/p there. Pt voiced understanding.

## 2017-01-19 NOTE — Telephone Encounter (Signed)
Will watch for ED records ,thanks

## 2017-01-19 NOTE — Telephone Encounter (Signed)
Pt scheduled a ER f/u

## 2017-01-23 ENCOUNTER — Ambulatory Visit (INDEPENDENT_AMBULATORY_CARE_PROVIDER_SITE_OTHER): Payer: BLUE CROSS/BLUE SHIELD | Admitting: Family Medicine

## 2017-01-23 ENCOUNTER — Encounter: Payer: Self-pay | Admitting: Family Medicine

## 2017-01-23 VITALS — BP 152/88 | HR 77 | Temp 97.9°F | Ht 70.5 in | Wt 197.0 lb

## 2017-01-23 DIAGNOSIS — I10 Essential (primary) hypertension: Secondary | ICD-10-CM | POA: Diagnosis not present

## 2017-01-23 DIAGNOSIS — N529 Male erectile dysfunction, unspecified: Secondary | ICD-10-CM | POA: Diagnosis not present

## 2017-01-23 MED ORDER — SILDENAFIL CITRATE 100 MG PO TABS: 100.0000 mg | ORAL_TABLET | Freq: Every day | ORAL | 5 refills | Status: DC | PRN

## 2017-01-23 MED ORDER — METOPROLOL SUCCINATE ER 50 MG PO TB24: 50.0000 mg | ORAL_TABLET | Freq: Every day | ORAL | 11 refills | Status: DC

## 2017-01-23 NOTE — Patient Instructions (Addendum)
Increase your metoprolol er to 50 mg daily  If you have side effects or problems let me know  If pulse is low (below 50) or you feel dizzy let me know   Think about learning about and practicing meditation  Look at apps like smiling mind (that are free)   Try viagra with caution - if you develop any side effects alert me   Follow up in December

## 2017-01-23 NOTE — Progress Notes (Signed)
Subjective:    Patient ID: Terry Lowe, male    DOB: [birth year], 61 y.o.   MRN: [id number]  HPI Here for f/u of ED visit at [place] on 10/26  He presented with bp of 220/120 at home  No symptoms but noted anxiety  First bp at [facility] was 193/105 with pulse of 101  Diagnosed with hypertensive urgency  Had a normal exam tx with metoprolol xr 25 mg daily   BP Readings from Last 3 Encounters:  01/23/17 (!) 154/90  01/19/17 (!) 155/110  12/27/16 (!) 158/98   Pulse Readings from Last 3 Encounters:  01/23/17 77  01/19/17 (!) 106  12/27/16 94   Last visit here inc enalapril to 20 mg On hctz 25   Very labile bp at home  Lowest 121/78 As high as 159/91  No cp or sob or leg swelling   Gets anxious easily -is his nature  He does not want anxiety medicine  Declines counseling    No side effects from new medicine   Trying to watch sodium as well as possible   Also needs refill of viagra-prev from urology  No side eff or problems with this  bp med may make ED worse    Lab Results  Component Value Date   CREATININE 0.89 11/06/2016   BUN 17 11/06/2016   NA 142 11/06/2016   K 4.0 11/06/2016   CL 105 11/06/2016   CO2 30 11/06/2016   Lab Results  Component Value Date   ALT 31 11/06/2016   AST 27 11/06/2016   ALKPHOS 62 11/06/2016   BILITOT 0.8 11/06/2016   Lab Results  Component Value Date   WBC 5.0 11/06/2016   HGB 16.2 11/06/2016   HCT 47.9 11/06/2016   MCV 91.5 11/06/2016   PLT 186.0 11/06/2016   Lab Results  Component Value Date   TSH 2.44 11/06/2016   Lab Results  Component Value Date   CHOL 186 11/06/2016   HDL 42.30 11/06/2016   LDLCALC 118 (H) 11/06/2016   LDLDIRECT 133.7 09/28/2009   TRIG 126.0 11/06/2016   CHOLHDL 4 11/06/2016    Patient Active Problem List   Diagnosis Date Noted  . Right shoulder pain 12/28/2016  . Hyperglycemia 08/31/2015  . Colon cancer screening 08/30/2015  . Routine general medical examination at a health care  facility 08/07/2015  . Erectile dysfunction of organic origin 02/09/2015  . BPH with obstruction/lower urinary tract symptoms 02/09/2015  . Hypogonadism in male 02/08/2015  . Allergic rhinitis 08/21/2014  . History of squamous cell carcinoma 09/28/2009  . Hyperlipidemia 06/03/2007  . 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 TESTOSTERONE DEFICIENCY 06/25/2006  . Essential hypertension 06/25/2006  . CAD 06/25/2006   Past Medical History:  Diagnosis Date  . BPH (benign prostatic hyperplasia)   . CAD (coronary artery disease)   . Erectile dysfunction   . HTN (hypertension)   . Hyperlipidemia   . Hypogonadism in male   . Low testosterone    with replacement  . Squamous cell carcinoma    neck mass- surgery- radical neck dissection/radiation and chemo  . Varicose vein    L lower leg- pain   Past Surgical History:  Procedure Laterality Date  . Cath (other)  2007   hospital for chest pain  . COLONOSCOPY WITH PROPOFOL N/A 11/22/2015   Procedure: COLONOSCOPY WITH PROPOFOL;  Surgeon: Manya Silvas, MD;  Location: [facility] ENDOSCOPY;  Service: Endoscopy;  Laterality: N/A;  . CT of chest  9/10   atelectasis  .  CT of neck  9/10   enlarged lymph node  . LN R neck  9/10   inconclusive fine needle bx  . squamous cell carcinoma/metastatic     foundin neck 10/10 radical neck dissection  . TSA  1960s   Social History  Substance Use Topics  . Smoking status: Former Research scientist (life sciences)  . Smokeless tobacco: Never Used     Comment: quit 20 years ago  . Alcohol use 0.0 oz/week     Comment: 1-2 beers per day occas   Family History  Problem Relation Age of Onset  . Coronary artery disease Father   . Melanoma Father   . Heart disease Father        CAD  . Cancer Father        melanoma  . Breast cancer Mother    Allergies  Allergen Reactions  . Pneumococcal Vaccine Polyvalent     REACTION: itching for over a year  . Statins     REACTION: myalgia severe   Current Outpatient Prescriptions on File Prior to Visit    Medication Sig Dispense Refill  . aspirin (BAYER ASPIRIN EC LOW DOSE) 81 MG EC tablet Take 81 mg by mouth daily.      . enalapril (VASOTEC) 20 MG tablet Take 1 tablet (20 mg total) by mouth daily. 30 tablet 11  . hydrochlorothiazide (HYDRODIURIL) 25 MG tablet Take 1 tablet (25 mg total) by mouth daily. 30 tablet 11  . meloxicam (MOBIC) 15 MG tablet Take 1 tablet (15 mg total) by mouth daily as needed for pain. With food as needed for shoulder pain 30 tablet 1  . vitamin C (ASCORBIC ACID) 500 MG tablet Take 500 mg by mouth daily.     No current facility-administered medications on file prior to visit.     Review of Systems  Constitutional: Negative for activity change, appetite change, fatigue, fever and unexpected weight change.  HENT: Negative for congestion, rhinorrhea, sore throat and trouble swallowing.   Eyes: Negative for pain, redness, itching and visual disturbance.  Respiratory: Negative for cough, chest tightness, shortness of breath and wheezing.   Cardiovascular: Negative for chest pain and palpitations.  Gastrointestinal: Negative for abdominal pain, blood in stool, constipation, diarrhea and nausea.  Endocrine: Negative for cold intolerance, heat intolerance, polydipsia and polyuria.  Genitourinary: Negative for difficulty urinating, dysuria, frequency and urgency.       Pos for erectile dysfunction  Musculoskeletal: Negative for arthralgias, joint swelling and myalgias.  Skin: Negative for pallor and rash.  Neurological: Negative for dizziness, tremors, weakness, numbness and headaches.  Hematological: Negative for adenopathy. Does not bruise/bleed easily.  Psychiatric/Behavioral: Negative for decreased concentration and dysphoric mood. The patient is not nervous/anxious.        Objective:   Physical Exam  Constitutional: He appears well-developed and well-nourished. No distress.  Well appearing   HENT:  Head: Normocephalic and atraumatic.  Mouth/Throat: Oropharynx  is clear and moist.  Eyes: Pupils are equal, round, and reactive to light. Conjunctivae and EOM are normal.  Neck: Normal range of motion. Neck supple. No JVD present. Carotid bruit is not present. No thyromegaly present.  Cardiovascular: Normal rate, regular rhythm, normal heart sounds and intact distal pulses.  Exam reveals no gallop.   Pulmonary/Chest: Effort normal and breath sounds normal. No respiratory distress. He has no wheezes. He has no rales.  No crackles  Abdominal: Soft. Bowel sounds are normal. He exhibits no distension, no abdominal bruit and no mass. There is no tenderness.  Musculoskeletal: He exhibits no edema.  Lymphadenopathy:    He has no cervical adenopathy.  Neurological: He is alert. He has normal reflexes. No cranial nerve deficit. He exhibits normal muscle tone. Coordination normal.  Skin: Skin is warm and dry. No rash noted. No pallor.  Scab on scalp from recently removed skin cancer  Psychiatric: He has a normal mood and affect.  Not seemingly anxious today          Assessment & Plan:   Problem List Items Addressed This Visit      Cardiovascular and Mediastinum   Essential hypertension - Primary    Labile and sub optimal control Reviewed hospital records, lab results and studies in detail   Some improvement with metoprolol er 25-will inc to 50 mg as tolerated Will watch for low pulse or hypotension F/u in about a month  Watching sodium  Mood infl his bp-disc meditation and other methods to help (declines med or counseling)- has many stressors  Urged to keep up the good health habits       Relevant Medications   metoprolol succinate (TOPROL-XL) 50 MG 24 hr tablet   sildenafil (VIAGRA) 100 MG tablet     Genitourinary   Erectile dysfunction of organic origin    Refilled viagra prev px by urology  He has had no side eff or bp issues with this  Can use if bp is controlled  Update if problems or issues   bp medications may worsen his ED       Relevant Medications   sildenafil (VIAGRA) 100 MG tablet

## 2017-01-23 NOTE — Assessment & Plan Note (Addendum)
Refilled viagra prev px by urology  He has had no side eff or bp issues with this  Can use if bp is controlled  Update if problems or issues   bp medications may worsen his ED

## 2017-01-23 NOTE — Assessment & Plan Note (Signed)
Labile and sub optimal control Reviewed hospital records, lab results and studies in detail   Some improvement with metoprolol er 25-will inc to 50 mg as tolerated Will watch for low pulse or hypotension F/u in about a month  Watching sodium  Mood infl his bp-disc meditation and other methods to help (declines med or counseling)- has many stressors  Urged to keep up the good health habits

## 2017-03-12 DIAGNOSIS — Z48817 Encounter for surgical aftercare following surgery on the skin and subcutaneous tissue: Secondary | ICD-10-CM | POA: Diagnosis not present

## 2017-05-07 DIAGNOSIS — L538 Other specified erythematous conditions: Secondary | ICD-10-CM | POA: Diagnosis not present

## 2017-05-07 DIAGNOSIS — L820 Inflamed seborrheic keratosis: Secondary | ICD-10-CM | POA: Diagnosis not present

## 2017-05-07 DIAGNOSIS — L570 Actinic keratosis: Secondary | ICD-10-CM | POA: Diagnosis not present

## 2017-05-07 DIAGNOSIS — L244 Irritant contact dermatitis due to drugs in contact with skin: Secondary | ICD-10-CM | POA: Diagnosis not present

## 2017-05-07 DIAGNOSIS — X32XXXA Exposure to sunlight, initial encounter: Secondary | ICD-10-CM | POA: Diagnosis not present

## 2017-05-07 DIAGNOSIS — L298 Other pruritus: Secondary | ICD-10-CM | POA: Diagnosis not present

## 2017-08-22 DIAGNOSIS — D485 Neoplasm of uncertain behavior of skin: Secondary | ICD-10-CM | POA: Diagnosis not present

## 2017-08-22 DIAGNOSIS — C44519 Basal cell carcinoma of skin of other part of trunk: Secondary | ICD-10-CM | POA: Diagnosis not present

## 2017-08-22 DIAGNOSIS — D225 Melanocytic nevi of trunk: Secondary | ICD-10-CM | POA: Diagnosis not present

## 2017-08-22 DIAGNOSIS — D2261 Melanocytic nevi of right upper limb, including shoulder: Secondary | ICD-10-CM | POA: Diagnosis not present

## 2017-08-22 DIAGNOSIS — X32XXXA Exposure to sunlight, initial encounter: Secondary | ICD-10-CM | POA: Diagnosis not present

## 2017-08-22 DIAGNOSIS — L821 Other seborrheic keratosis: Secondary | ICD-10-CM | POA: Diagnosis not present

## 2017-08-22 DIAGNOSIS — C44719 Basal cell carcinoma of skin of left lower limb, including hip: Secondary | ICD-10-CM | POA: Diagnosis not present

## 2017-08-22 DIAGNOSIS — L570 Actinic keratosis: Secondary | ICD-10-CM | POA: Diagnosis not present

## 2017-10-15 ENCOUNTER — Ambulatory Visit: Payer: BLUE CROSS/BLUE SHIELD | Admitting: Family Medicine

## 2017-10-15 ENCOUNTER — Encounter: Payer: Self-pay | Admitting: Family Medicine

## 2017-10-15 VITALS — BP 144/88 | HR 77 | Ht 70.5 in | Wt 193.0 lb

## 2017-10-15 DIAGNOSIS — I10 Essential (primary) hypertension: Secondary | ICD-10-CM

## 2017-10-15 MED ORDER — METOPROLOL SUCCINATE ER 100 MG PO TB24: 100.0000 mg | ORAL_TABLET | Freq: Every day | ORAL | 11 refills | Status: DC

## 2017-10-15 NOTE — Patient Instructions (Addendum)
Make sure you are getting at least 30 minutes of exercise per day  Increase your metoprolol xl to 100 mg once daily (so 2 of the pills you have now)  Then fill px for 100 if well tolerated  If you feel lightheaded or if you bp is lower than 90/60 or pulse is slow (under 50) we would try something else  Follow up in 2-4 weeks   Eat less processed foods  Drink your water

## 2017-10-15 NOTE — Assessment & Plan Note (Signed)
bp is elevated/though imp with 2nd reading Was very elevated over weekend- was not eating well BP Readings from Last 1 Encounters:  10/15/17 (!) 144/88   Will inc his metoprolol xl to 100 mg watching for side eff incl low pulse/bp and light headedness (will update if occurs)  Most recent labs reviewed  Disc lifstyle change with low sodium diet and exercise  DASH eating handout given  F/u 2-4 weeks or earlier if needed

## 2017-10-15 NOTE — Progress Notes (Signed)
Subjective:    Patient ID: Terry Lowe, male    DOB: November 03, 1955, 62 y.o.   MRN: [id number]  HPI Here for HTN  Increased blood pressure last weekend -was also flushed feeling and mild headache  189/102  Up the whole weekend  No idea why - but was on vacation  A little alcohol  Eating vacation food - saltier stuff  Was at the beach (does not feel like he had any heat related illness)   Took extra 1/2 tab of enalapril,  hctz and metoprolol for 3 d last week   Wt Readings from Last 3 Encounters:  10/15/17 193 lb (87.5 kg)  01/23/17 197 lb (89.4 kg)  01/19/17 194 lb 6 oz (88.2 kg)   27.30 kg/m    BP Readings from Last 3 Encounters:  10/15/17 (!) 158/90  01/23/17 (!) 152/88  01/19/17 (!) 155/110  2nd check a little better BP: (!) 144/88      Pulse Readings from Last 3 Encounters:  10/15/17 77  01/23/17 77  01/19/17 (!) 106   Current medicines  Enalapril 20 mg daily  (max dose)  hctz 25 mg daily  Metoprolol xl 50 mg daily   Back to eating regularly- trying to avoid processed foods  Exercise-is constant - on the go and some walks  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   Stress level is still off the charts high  He tries to let it go  Needs to take his boat out Needs to deep breathe/meditate     Hx of CAD No cp but had indigestion at the beach   Last labs Lab Results  Component Value Date   CREATININE 0.89 11/06/2016   BUN 17 11/06/2016   NA 142 11/06/2016   K 4.0 11/06/2016   CL 105 11/06/2016   CO2 30 11/06/2016   Lab Results  Component Value Date   ALT 31 11/06/2016   AST 27 11/06/2016   ALKPHOS 62 11/06/2016   BILITOT 0.8 11/06/2016   Lab Results  Component Value Date   WBC 5.0 11/06/2016   HGB 16.2 11/06/2016   HCT 47.9 11/06/2016   MCV 91.5 11/06/2016   PLT 186.0 11/06/2016   Lab Results  Component Value Date   TSH 2.44 11/06/2016    Lab Results  Component Value Date   HGBA1C 5.9 11/06/2016   Lab Results    Component Value Date   CHOL 186 11/06/2016   HDL 42.30 11/06/2016   LDLCALC 118 (H) 11/06/2016   LDLDIRECT 133.7 09/28/2009   TRIG 126.0 11/06/2016   CHOLHDL 4 11/06/2016  (cannot take statins)   Patient Active Problem List   Diagnosis Date Noted  . Right shoulder pain 12/28/2016  . Hyperglycemia 08/31/2015  . Colon cancer screening 08/30/2015  . Routine general medical examination at a health care facility 08/07/2015  . Erectile dysfunction of organic origin 02/09/2015  . BPH with obstruction/lower urinary tract symptoms 02/09/2015  . Hypogonadism in male 02/08/2015  . Allergic rhinitis 08/21/2014  . History of squamous cell carcinoma 09/28/2009  . Hyperlipidemia 06/03/2007  . 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 TESTOSTERONE DEFICIENCY 06/25/2006  . Essential hypertension 06/25/2006  . CAD 06/25/2006   Past Medical History:  Diagnosis Date  . BPH (benign prostatic hyperplasia)   . CAD (coronary artery disease)   . Erectile dysfunction   . HTN (hypertension)   . Hyperlipidemia   . Hypogonadism in male   . Low testosterone    with replacement  .  Squamous cell carcinoma    neck mass- surgery- radical neck dissection/radiation and chemo  . Varicose vein    L lower leg- pain   Past Surgical History:  Procedure Laterality Date  . Cath (other)  2007   hospital for chest pain  . COLONOSCOPY WITH PROPOFOL N/A 11/22/2015   Procedure: COLONOSCOPY WITH PROPOFOL;  Surgeon: Manya Silvas, MD;  Location: [facility] ENDOSCOPY;  Service: Endoscopy;  Laterality: N/A;  . CT of chest  9/10   atelectasis  . CT of neck  9/10   enlarged lymph node  . LN R neck  9/10   inconclusive fine needle bx  . squamous cell carcinoma/metastatic     foundin neck 10/10 radical neck dissection  . TSA  1960s   Social History   Tobacco Use  . Smoking status: Former Research scientist (life sciences)  . Smokeless tobacco: Never Used  . Tobacco comment: quit 20 years ago  Substance Use Topics  . Alcohol use: Yes    Alcohol/week: 0.0  oz    Comment: 1-2 beers per day occas  . Drug use: No   Family History  Problem Relation Age of Onset  . Coronary artery disease Father   . Melanoma Father   . Heart disease Father        CAD  . Cancer Father        melanoma  . Breast cancer Mother    Allergies  Allergen Reactions  . Pneumococcal Vaccine Polyvalent     REACTION: itching for over a year  . Statins     REACTION: myalgia severe   Current Outpatient Medications on File Prior to Visit  Medication Sig Dispense Refill  . aspirin (BAYER ASPIRIN EC LOW DOSE) 81 MG EC tablet Take 81 mg by mouth daily.      . enalapril (VASOTEC) 20 MG tablet Take 1 tablet (20 mg total) by mouth daily. 30 tablet 11  . hydrochlorothiazide (HYDRODIURIL) 25 MG tablet Take 1 tablet (25 mg total) by mouth daily. 30 tablet 11  . sildenafil (VIAGRA) 100 MG tablet Take 1 tablet (100 mg total) by mouth daily as needed for erectile dysfunction. 10 tablet 5  . vitamin C (ASCORBIC ACID) 500 MG tablet Take 500 mg by mouth daily.    . meloxicam (MOBIC) 15 MG tablet Take 1 tablet (15 mg total) by mouth daily as needed for pain. With food as needed for shoulder pain (Patient not taking: Reported on 10/15/2017) 30 tablet 1   No current facility-administered medications on file prior to visit.     Review of Systems  Constitutional: Negative for activity change, appetite change, fatigue, fever and unexpected weight change.  HENT: Negative for congestion, rhinorrhea, sore throat and trouble swallowing.   Eyes: Negative for pain, redness, itching and visual disturbance.  Respiratory: Negative for cough, chest tightness, shortness of breath and wheezing.   Cardiovascular: Negative for chest pain, palpitations and leg swelling.       Flushing is resolved  Gastrointestinal: Negative for abdominal pain, blood in stool, constipation, diarrhea and nausea.  Endocrine: Negative for cold intolerance, heat intolerance, polydipsia and polyuria.  Genitourinary: Negative  for difficulty urinating, dysuria, frequency and urgency.  Musculoskeletal: Negative for arthralgias, joint swelling and myalgias.  Skin: Negative for pallor and rash.  Neurological: Negative for dizziness, tremors, weakness, numbness and headaches.       Headache from elevated bp is resolved   Hematological: Negative for adenopathy. Does not bruise/bleed easily.  Psychiatric/Behavioral: Negative for decreased concentration and  dysphoric mood. The patient is not nervous/anxious.        Stressors as usual       Objective:   Physical Exam  Constitutional: He appears well-developed and well-nourished. No distress.  Well appearing   HENT:  Head: Normocephalic and atraumatic.  Mouth/Throat: Oropharynx is clear and moist.  Eyes: Pupils are equal, round, and reactive to light. Conjunctivae and EOM are normal. No scleral icterus.  Neck: Normal range of motion. Neck supple. No JVD present. Carotid bruit is not present. No thyromegaly present.  Cardiovascular: Normal rate, regular rhythm, normal heart sounds and intact distal pulses. Exam reveals no gallop.  Pulmonary/Chest: Effort normal and breath sounds normal. No respiratory distress. He has no wheezes. He has no rales.  No crackles  Good air exch   Abdominal: Soft. Bowel sounds are normal. He exhibits no distension, no abdominal bruit and no mass. There is no hepatosplenomegaly. There is no tenderness.  Musculoskeletal: He exhibits no edema or tenderness.  Lymphadenopathy:    He has no cervical adenopathy.  Neurological: He is alert. He has normal reflexes. He displays normal reflexes. No cranial nerve deficit. He exhibits normal muscle tone. Coordination normal.  Skin: Skin is warm and dry. No rash noted.  Tanned Solar lentigines diffusely   Psychiatric: He has a normal mood and affect.  Pleasant  Seems fatigued           Assessment & Plan:   Problem List Items Addressed This Visit      Cardiovascular and Mediastinum    Essential hypertension - Primary    bp is elevated/though imp with 2nd reading Was very elevated over weekend- was not eating well BP Readings from Last 1 Encounters:  10/15/17 (!) 144/88   Will inc his metoprolol xl to 100 mg watching for side eff incl low pulse/bp and light headedness (will update if occurs)  Most recent labs reviewed  Disc lifstyle change with low sodium diet and exercise  DASH eating handout given  F/u 2-4 weeks or earlier if needed        Relevant Medications   metoprolol succinate (TOPROL-XL) 100 MG 24 hr tablet

## 2017-11-12 ENCOUNTER — Encounter: Payer: Self-pay | Admitting: Family Medicine

## 2017-11-12 ENCOUNTER — Ambulatory Visit: Payer: BLUE CROSS/BLUE SHIELD | Admitting: Family Medicine

## 2017-11-12 VITALS — BP 128/86 | HR 63 | Temp 98.1°F | Ht 70.5 in | Wt 193.5 lb

## 2017-11-12 DIAGNOSIS — I10 Essential (primary) hypertension: Secondary | ICD-10-CM

## 2017-11-12 DIAGNOSIS — N529 Male erectile dysfunction, unspecified: Secondary | ICD-10-CM

## 2017-11-12 MED ORDER — HYDROCHLOROTHIAZIDE 25 MG PO TABS
25.0000 mg | ORAL_TABLET | Freq: Every day | ORAL | 11 refills | Status: DC
Start: 2017-11-12 — End: 2018-12-09

## 2017-11-12 MED ORDER — SILDENAFIL CITRATE 100 MG PO TABS: 100.0000 mg | ORAL_TABLET | Freq: Every day | ORAL | 11 refills | Status: DC | PRN

## 2017-11-12 MED ORDER — ENALAPRIL MALEATE 20 MG PO TABS: 20.0000 mg | ORAL_TABLET | Freq: Every day | ORAL | 11 refills | Status: DC

## 2017-11-12 MED ORDER — METOPROLOL SUCCINATE ER 100 MG PO TB24: 100.0000 mg | ORAL_TABLET | Freq: Every day | ORAL | 11 refills | Status: DC

## 2017-11-12 NOTE — Assessment & Plan Note (Signed)
viagra refilled.  

## 2017-11-12 NOTE — Patient Instructions (Addendum)
Stick with the same medicine Take care of yourself   Follow up in 6 months for annual exam

## 2017-11-12 NOTE — Progress Notes (Signed)
Subjective:    Patient ID: Terry Lowe, male    DOB: May 29, 1955, 62 y.o.   MRN: [id number]  HPI Here for f/u of HTN  Wt Readings from Last 3 Encounters:  11/12/17 193 lb 8 oz (87.8 kg)  10/15/17 193 lb (87.5 kg)  01/23/17 197 lb (89.4 kg)   27.37 kg/m   Last visit bp was elevated in and out of the office We increased his metopolol xl to 100 mg   BP Readings from Last 3 Encounters:  11/12/17 128/86  10/15/17 (!) 144/88  01/23/17 (!) 152/88   It has been better at home also   Pulse Readings from Last 3 Encounters:  11/12/17 63  10/15/17 77  01/23/17 77   A little sluggish  A little lazy   He thinks he gets enough sleep  No other side effects   Patient Active Problem List   Diagnosis Date Noted  . Right shoulder pain 12/28/2016  . Hyperglycemia 08/31/2015  . Colon cancer screening 08/30/2015  . Routine general medical examination at a health care facility 08/07/2015  . Erectile dysfunction of organic origin 02/09/2015  . BPH with obstruction/lower urinary tract symptoms 02/09/2015  . Hypogonadism in male 02/08/2015  . Allergic rhinitis 08/21/2014  . History of squamous cell carcinoma 09/28/2009  . Hyperlipidemia 06/03/2007  . 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 TESTOSTERONE DEFICIENCY 06/25/2006  . Essential hypertension 06/25/2006  . CAD 06/25/2006   Past Medical History:  Diagnosis Date  . BPH (benign prostatic hyperplasia)   . CAD (coronary artery disease)   . Erectile dysfunction   . HTN (hypertension)   . Hyperlipidemia   . Hypogonadism in male   . Low testosterone    with replacement  . Squamous cell carcinoma    neck mass- surgery- radical neck dissection/radiation and chemo  . Varicose vein    L lower leg- pain   Past Surgical History:  Procedure Laterality Date  . Cath (other)  2007   hospital for chest pain  . COLONOSCOPY WITH PROPOFOL N/A 11/22/2015   Procedure: COLONOSCOPY WITH PROPOFOL;  Surgeon: Manya Silvas, MD;  Location: [facility]  ENDOSCOPY;  Service: Endoscopy;  Laterality: N/A;  . CT of chest  9/10   atelectasis  . CT of neck  9/10   enlarged lymph node  . LN R neck  9/10   inconclusive fine needle bx  . squamous cell carcinoma/metastatic     foundin neck 10/10 radical neck dissection  . TSA  1960s   Social History   Tobacco Use  . Smoking status: Former Research scientist (life sciences)  . Smokeless tobacco: Never Used  . Tobacco comment: quit 20 years ago  Substance Use Topics  . Alcohol use: Yes    Alcohol/week: 0.0 standard drinks    Comment: 1-2 beers per day occas  . Drug use: No   Family History  Problem Relation Age of Onset  . Coronary artery disease Father   . Melanoma Father   . Heart disease Father        CAD  . Cancer Father        melanoma  . Breast cancer Mother    Allergies  Allergen Reactions  . Pneumococcal Vaccine Polyvalent     REACTION: itching for over a year  . Statins     REACTION: myalgia severe   Current Outpatient Medications on File Prior to Visit  Medication Sig Dispense Refill  . aspirin (BAYER ASPIRIN EC LOW DOSE) 81 MG EC tablet Take 81  mg by mouth daily.      . vitamin C (ASCORBIC ACID) 500 MG tablet Take 500 mg by mouth daily.     No current facility-administered medications on file prior to visit.     Review of Systems  Constitutional: Positive for fatigue. Negative for activity change, appetite change, fever and unexpected weight change.       More sluggish  HENT: Negative for congestion, rhinorrhea, sore throat and trouble swallowing.   Eyes: Negative for pain, redness, itching and visual disturbance.  Respiratory: Negative for cough, chest tightness, shortness of breath and wheezing.   Cardiovascular: Negative for chest pain, palpitations and leg swelling.  Gastrointestinal: Negative for abdominal pain, blood in stool, constipation, diarrhea and nausea.  Endocrine: Negative for cold intolerance, heat intolerance, polydipsia and polyuria.  Genitourinary: Negative for  difficulty urinating, dysuria, frequency and urgency.  Musculoskeletal: Negative for arthralgias, joint swelling and myalgias.  Skin: Negative for pallor and rash.  Neurological: Negative for dizziness, tremors, weakness, numbness and headaches.  Hematological: Negative for adenopathy. Does not bruise/bleed easily.  Psychiatric/Behavioral: Negative for decreased concentration and dysphoric mood. The patient is not nervous/anxious.        Objective:   Physical Exam  Constitutional: He appears well-developed and well-nourished. No distress.  Well appearing   HENT:  Head: Normocephalic and atraumatic.  Mouth/Throat: Oropharynx is clear and moist.  Eyes: Pupils are equal, round, and reactive to light. Conjunctivae and EOM are normal.  Neck: Normal range of motion. Neck supple. No JVD present. Carotid bruit is not present. No thyromegaly present.  Cardiovascular: Normal rate, regular rhythm, normal heart sounds and intact distal pulses. Exam reveals no gallop.  Pulmonary/Chest: Effort normal and breath sounds normal. No respiratory distress. He has no wheezes. He has no rales.  No crackles  Abdominal: Soft. He exhibits no distension and no abdominal bruit.  Musculoskeletal: He exhibits no edema.  Lymphadenopathy:    He has no cervical adenopathy.  Neurological: He is alert. He has normal reflexes. He displays normal reflexes. No cranial nerve deficit. Coordination normal.  Skin: Skin is warm and dry. No rash noted.  Solar skin damage noted   Psychiatric: He has a normal mood and affect.          Assessment & Plan:   Problem List Items Addressed This Visit      Cardiovascular and Mediastinum   Essential hypertension - Primary    Improved with inc of metoprolol xl to 100 mg  BP: 128/86  Pulse Rate: 63   Slightly sluggish-hope this will pass Overall tolerating well  Continue ace and hctz  Healthy diet and exercise enc as well       Relevant Medications   enalapril  (VASOTEC) 20 MG tablet   hydrochlorothiazide (HYDRODIURIL) 25 MG tablet   metoprolol succinate (TOPROL-XL) 100 MG 24 hr tablet   sildenafil (VIAGRA) 100 MG tablet     Genitourinary   Erectile dysfunction of organic origin    viagra refilled       Relevant Medications   sildenafil (VIAGRA) 100 MG tablet

## 2017-11-12 NOTE — Assessment & Plan Note (Signed)
Improved with inc of metoprolol xl to 100 mg  BP: 128/86  Pulse Rate: 63   Slightly sluggish-hope this will pass Overall tolerating well  Continue ace and hctz  Healthy diet and exercise enc as well

## 2017-12-12 DIAGNOSIS — C44519 Basal cell carcinoma of skin of other part of trunk: Secondary | ICD-10-CM | POA: Diagnosis not present

## 2017-12-12 DIAGNOSIS — D485 Neoplasm of uncertain behavior of skin: Secondary | ICD-10-CM | POA: Diagnosis not present

## 2017-12-12 DIAGNOSIS — C44719 Basal cell carcinoma of skin of left lower limb, including hip: Secondary | ICD-10-CM | POA: Diagnosis not present

## 2017-12-12 DIAGNOSIS — X32XXXA Exposure to sunlight, initial encounter: Secondary | ICD-10-CM | POA: Diagnosis not present

## 2017-12-12 DIAGNOSIS — L538 Other specified erythematous conditions: Secondary | ICD-10-CM | POA: Diagnosis not present

## 2017-12-12 DIAGNOSIS — L570 Actinic keratosis: Secondary | ICD-10-CM | POA: Diagnosis not present

## 2018-01-11 DIAGNOSIS — D485 Neoplasm of uncertain behavior of skin: Secondary | ICD-10-CM | POA: Diagnosis not present

## 2018-01-11 DIAGNOSIS — D2261 Melanocytic nevi of right upper limb, including shoulder: Secondary | ICD-10-CM | POA: Diagnosis not present

## 2018-01-11 DIAGNOSIS — D225 Melanocytic nevi of trunk: Secondary | ICD-10-CM | POA: Diagnosis not present

## 2018-01-11 DIAGNOSIS — Z08 Encounter for follow-up examination after completed treatment for malignant neoplasm: Secondary | ICD-10-CM | POA: Diagnosis not present

## 2018-01-11 DIAGNOSIS — X32XXXA Exposure to sunlight, initial encounter: Secondary | ICD-10-CM | POA: Diagnosis not present

## 2018-01-11 DIAGNOSIS — D044 Carcinoma in situ of skin of scalp and neck: Secondary | ICD-10-CM | POA: Diagnosis not present

## 2018-01-11 DIAGNOSIS — L570 Actinic keratosis: Secondary | ICD-10-CM | POA: Diagnosis not present

## 2018-02-18 DIAGNOSIS — L570 Actinic keratosis: Secondary | ICD-10-CM | POA: Diagnosis not present

## 2018-02-18 DIAGNOSIS — X32XXXA Exposure to sunlight, initial encounter: Secondary | ICD-10-CM | POA: Diagnosis not present

## 2018-02-18 DIAGNOSIS — D044 Carcinoma in situ of skin of scalp and neck: Secondary | ICD-10-CM | POA: Diagnosis not present

## 2018-04-10 ENCOUNTER — Encounter: Payer: Self-pay | Admitting: Family Medicine

## 2018-04-10 ENCOUNTER — Ambulatory Visit: Payer: BLUE CROSS/BLUE SHIELD | Admitting: Family Medicine

## 2018-04-10 VITALS — BP 128/88 | HR 73 | Temp 98.1°F | Resp 10 | Ht 70.75 in | Wt 198.5 lb

## 2018-04-10 DIAGNOSIS — J069 Acute upper respiratory infection, unspecified: Secondary | ICD-10-CM

## 2018-04-10 NOTE — Progress Notes (Signed)
Subjective:     Terry Lowe is a 63 y.o. male presenting for Sore Throat (Symptoms started 2 days ago. Runny nose, post nasal drainage, ears feel full, a little cough. No fever. Has taking OTC cold medicine. )     URI   This is a new problem. The current episode started in the past 7 days. The problem has been unchanged. There has been no fever. Associated symptoms include coughing, headaches, a plugged ear sensation, rhinorrhea and a sore throat. Pertinent negatives include no abdominal pain, chest pain, congestion, nausea, sinus pain or vomiting. He has tried decongestant for the symptoms. The treatment provided no relief.    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 Negative for congestion and sinus pain.   Respiratory: Positive for cough.   Cardiovascular: Negative for chest pain.  Gastrointestinal: Negative for abdominal pain, nausea and vomiting.  Musculoskeletal: Negative for arthralgias and myalgias.  Neurological: Positive for headaches.     Social History   Tobacco Use  Smoking Status Former Smoker  Smokeless Tobacco Never Used  Tobacco Comment   quit 20 years ago        Objective:    BP Readings from Last 3 Encounters:  04/10/18 128/88  11/12/17 128/86  10/15/17 (!) 144/88   Wt Readings from Last 3 Encounters:  04/10/18 198 lb 8 oz (90 kg)  11/12/17 193 lb 8 oz (87.8 kg)  10/15/17 193 lb (87.5 kg)    BP 128/88   Pulse 73   Temp 98.1 F (36.7 C)   Resp 10   Ht 5' 10.75" (1.797 m)   Wt 198 lb 8 oz (90 kg)   SpO2 97%   BMI 27.88 kg/m    Physical Exam Constitutional:      General: He is not in acute distress.    Appearance: He is well-developed. He is not ill-appearing.  HENT:     Head: Normocephalic and atraumatic.     Right Ear: Tympanic membrane and ear canal normal.     Left Ear: Tympanic membrane  and ear canal normal.     Nose: Mucosal edema and rhinorrhea present.     Right Sinus: No maxillary sinus tenderness or frontal sinus tenderness.     Left Sinus: No maxillary sinus tenderness or frontal sinus tenderness.     Mouth/Throat:     Pharynx: Uvula midline. Posterior oropharyngeal erythema present. No oropharyngeal exudate.     Tonsils: Swelling: 0 on the right. 0 on the left.  Neck:     Musculoskeletal: Neck supple.  Cardiovascular:     Rate and Rhythm: Normal rate and regular rhythm.     Heart sounds: No murmur.  Pulmonary:     Effort: Pulmonary effort is normal. No respiratory distress.     Breath sounds: Normal breath sounds.  Lymphadenopathy:     Cervical: No cervical adenopathy.  Skin:    General: Skin is warm and dry.     Capillary Refill: Capillary refill takes less than 2 seconds.  Neurological:     Mental Status: He is alert.           Assessment & Plan:   Problem List Items Addressed This Visit    None    Visit Diagnoses    Viral URI    -  Primary     Ears cleaned out.  No sign of infection.   Virus - symptomatic care  Return if symptoms worsen or fail to improve.  Lesleigh Noe, MD

## 2018-04-10 NOTE — Patient Instructions (Signed)

## 2018-04-22 DIAGNOSIS — R070 Pain in throat: Secondary | ICD-10-CM | POA: Diagnosis not present

## 2018-04-22 DIAGNOSIS — J019 Acute sinusitis, unspecified: Secondary | ICD-10-CM | POA: Diagnosis not present

## 2018-05-12 ENCOUNTER — Telehealth: Payer: Self-pay | Admitting: Family Medicine

## 2018-05-12 DIAGNOSIS — R739 Hyperglycemia, unspecified: Secondary | ICD-10-CM

## 2018-05-12 DIAGNOSIS — I10 Essential (primary) hypertension: Secondary | ICD-10-CM

## 2018-05-12 DIAGNOSIS — E7800 Pure hypercholesterolemia, unspecified: Secondary | ICD-10-CM

## 2018-05-12 NOTE — Telephone Encounter (Signed)
-----   Message from Ellamae Sia sent at 05/08/2018 11:36 AM EST ----- Regarding: Lab orders for Monday, 2.17.20 Patient is scheduled for CPX labs, please order future labs, Thanks , Karna Christmas

## 2018-05-13 ENCOUNTER — Other Ambulatory Visit (INDEPENDENT_AMBULATORY_CARE_PROVIDER_SITE_OTHER): Payer: BLUE CROSS/BLUE SHIELD

## 2018-05-13 DIAGNOSIS — I10 Essential (primary) hypertension: Secondary | ICD-10-CM | POA: Diagnosis not present

## 2018-05-13 DIAGNOSIS — R739 Hyperglycemia, unspecified: Secondary | ICD-10-CM | POA: Diagnosis not present

## 2018-05-13 DIAGNOSIS — E7800 Pure hypercholesterolemia, unspecified: Secondary | ICD-10-CM

## 2018-05-13 LAB — CBC WITH DIFFERENTIAL/PLATELET
Basophils Absolute: 0 10*3/uL (ref 0.0–0.1)
Basophils Relative: 0.5 % (ref 0.0–3.0)
Eosinophils Absolute: 0.1 10*3/uL (ref 0.0–0.7)
Eosinophils Relative: 2.6 % (ref 0.0–5.0)
HEMATOCRIT: 45.9 % (ref 39.0–52.0)
HEMOGLOBIN: 15.3 g/dL (ref 13.0–17.0)
LYMPHS PCT: 22.8 % (ref 12.0–46.0)
Lymphs Abs: 0.9 10*3/uL (ref 0.7–4.0)
MCHC: 33.4 g/dL (ref 30.0–36.0)
MCV: 89.5 fl (ref 78.0–100.0)
Monocytes Absolute: 0.4 10*3/uL (ref 0.1–1.0)
Monocytes Relative: 11.4 % (ref 3.0–12.0)
Neutro Abs: 2.4 10*3/uL (ref 1.4–7.7)
Neutrophils Relative %: 62.7 % (ref 43.0–77.0)
Platelets: 167 10*3/uL (ref 150.0–400.0)
RBC: 5.13 Mil/uL (ref 4.22–5.81)
RDW: 13.1 % (ref 11.5–15.5)
WBC: 3.8 10*3/uL — ABNORMAL LOW (ref 4.0–10.5)

## 2018-05-13 LAB — COMPREHENSIVE METABOLIC PANEL
ALK PHOS: 53 U/L (ref 39–117)
ALT: 33 U/L (ref 0–53)
AST: 24 U/L (ref 0–37)
Albumin: 4.1 g/dL (ref 3.5–5.2)
BUN: 24 mg/dL — ABNORMAL HIGH (ref 6–23)
CALCIUM: 9.2 mg/dL (ref 8.4–10.5)
CO2: 29 mEq/L (ref 19–32)
Chloride: 104 mEq/L (ref 96–112)
Creatinine, Ser: 1.04 mg/dL (ref 0.40–1.50)
GFR: 72.55 mL/min (ref >60.00)
Glucose, Bld: 124 mg/dL — ABNORMAL HIGH (ref 70–99)
Potassium: 5 mEq/L (ref 3.5–5.1)
Sodium: 140 mEq/L (ref 135–145)
Total Bilirubin: 0.5 mg/dL (ref 0.2–1.2)
Total Protein: 6.4 g/dL (ref 6.0–8.3)

## 2018-05-13 LAB — LIPID PANEL
CHOL/HDL RATIO: 5
Cholesterol: 205 mg/dL — ABNORMAL HIGH (ref 0–200)
HDL: 40.2 mg/dL (ref >39.00)
LDL Cholesterol: 134 mg/dL — ABNORMAL HIGH (ref 0–99)
NonHDL: 164.37
Triglycerides: 151 mg/dL — ABNORMAL HIGH (ref 0.0–149.0)
VLDL: 30.2 mg/dL (ref 0.0–40.0)

## 2018-05-13 LAB — HEMOGLOBIN A1C: Hgb A1c MFr Bld: 6 % (ref 4.6–6.5)

## 2018-05-13 LAB — TSH: TSH: 3.17 u[IU]/mL (ref 0.35–4.50)

## 2018-05-16 ENCOUNTER — Encounter: Payer: BLUE CROSS/BLUE SHIELD | Admitting: Family Medicine

## 2018-05-21 ENCOUNTER — Encounter: Payer: BLUE CROSS/BLUE SHIELD | Admitting: Family Medicine

## 2018-10-14 ENCOUNTER — Ambulatory Visit (INDEPENDENT_AMBULATORY_CARE_PROVIDER_SITE_OTHER): Payer: BC Managed Care – PPO | Admitting: Family Medicine

## 2018-10-14 ENCOUNTER — Encounter: Payer: Self-pay | Admitting: Family Medicine

## 2018-10-14 DIAGNOSIS — R0981 Nasal congestion: Secondary | ICD-10-CM | POA: Diagnosis not present

## 2018-10-14 DIAGNOSIS — H1032 Unspecified acute conjunctivitis, left eye: Secondary | ICD-10-CM

## 2018-10-14 DIAGNOSIS — C7989 Secondary malignant neoplasm of other specified sites: Secondary | ICD-10-CM | POA: Diagnosis not present

## 2018-10-14 DIAGNOSIS — E7800 Pure hypercholesterolemia, unspecified: Secondary | ICD-10-CM

## 2018-10-14 DIAGNOSIS — C801 Malignant (primary) neoplasm, unspecified: Secondary | ICD-10-CM

## 2018-10-14 DIAGNOSIS — H109 Unspecified conjunctivitis: Secondary | ICD-10-CM | POA: Insufficient documentation

## 2018-10-14 MED ORDER — NEOMYCIN-POLYMYXIN-HC OP SUSP: 1.0000 [drp] | Freq: Three times a day (TID) | OPHTHALMIC | 0 refills | Status: AC

## 2018-10-14 NOTE — Assessment & Plan Note (Signed)
Reviewed labs from February- LDL was up a bit Disc goals for lipids and reasons to control them Rev last labs with pt Rev low sat fat diet in detail He has already started a better diet since then

## 2018-10-14 NOTE — Assessment & Plan Note (Signed)
This may be viral or allergic Improved with claritin D - has to watch bp with this Also has conjunctivitis- so viral is likely  Recommend nasal saline flonase or nasacort ns as needed Watch for sinus pain/purulent d/c Watch for fever /cough (would need covid testing under those circimstances) Update if not starting to improve in a week or if worsening

## 2018-10-14 NOTE — Progress Notes (Signed)
Virtual Visit via Video Note  I connected with Terry Lowe on 10/14/18 at  2:30 PM EDT by a video enabled telemedicine application and verified that I am speaking with the correct person using two identifiers.  Location: Patient: home Provider: office    I discussed the limitations of evaluation and management by telemedicine and the availability of in person appointments. The patient expressed understanding and agreed to proceed. Video portion of visit failed today so it was conducted by phone   History of Present Illness: Pt presents with eye irritation and congestion  L eye only   Woke up sat am - stuffy/sneezed  Sunday woke up with symptoms of pink eye   Took some claritin D (helped run)     (150/91 bp today)  His nose ran (all clear)  Eye itched like "it had cold" in it   No cough at all  No sob  Has not lost his sense of taste or smell Good appetite   Wife had 3 tablets of augmentin  He took them   His eye improved - still pink and irritated   No fever No aches  No headache now (slight one this am-better now) = claritin D helps  No facial pain  No sick contacts that he knows of  Goes to work with a mask   Has not used his nasal saline -that sometimes makes him worse  flonase irritates him   He sees Dr Richardson Landry for ENT  He has frequent skin cancer   Wanted to rev last labs Lab Results  Component Value Date   CHOL 205 (H) 05/13/2018   CHOL 186 11/06/2016   CHOL 178 08/30/2015   Lab Results  Component Value Date   HDL 40.20 05/13/2018   HDL 42.30 11/06/2016   HDL 39.50 08/30/2015   Lab Results  Component Value Date   LDLCALC 134 (H) 05/13/2018   LDLCALC 118 (H) 11/06/2016   [place] 115 (H) 08/30/2015   Lab Results  Component Value Date   TRIG 151.0 (H) 05/13/2018   TRIG 126.0 11/06/2016   TRIG 117.0 08/30/2015   Lab Results  Component Value Date   CHOLHDL 5 05/13/2018   CHOLHDL 4 11/06/2016   CHOLHDL 5 08/30/2015   Lab Results   Component Value Date   LDLDIRECT 133.7 09/28/2009   LDLDIRECT 132.5 07/22/2008   LDL was up -he is eating better since then however No cardiac issues  Is statin intolerant   Patient Active Problem List   Diagnosis Date Noted  . Conjunctivitis 10/14/2018  . Nasal congestion 10/14/2018  . Right shoulder pain 12/28/2016  . 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 Prediabetes 08/31/2015  . Colon cancer screening 08/30/2015  . Routine general medical examination at a health care facility 08/07/2015  . Erectile dysfunction of organic origin 02/09/2015  . BPH with obstruction/lower urinary tract symptoms 02/09/2015  . Hypogonadism in male 02/08/2015  . Allergic rhinitis 08/21/2014  . History of squamous cell carcinoma 09/28/2009  . Hyperlipidemia 06/03/2007  . 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 TESTOSTERONE DEFICIENCY 06/25/2006  . Essential hypertension 06/25/2006  . CAD 06/25/2006   Past Medical History:  Diagnosis Date  . BPH (benign prostatic hyperplasia)   . CAD (coronary artery disease)   . Erectile dysfunction   . HTN (hypertension)   . Hyperlipidemia   . Hypogonadism in male   . Low testosterone    with replacement  . Squamous cell  carcinoma    neck mass- surgery- radical neck dissection/radiation and chemo  . Varicose vein    L lower leg- pain   Past Surgical History:  Procedure Laterality Date  . Cath (other)  2007   hospital for chest pain  . COLONOSCOPY WITH PROPOFOL N/A 11/22/2015   Procedure: COLONOSCOPY WITH PROPOFOL;  Surgeon: Manya Silvas, MD;  Location: [facility] ENDOSCOPY;  Service: Endoscopy;  Laterality: N/A;  . CT of chest  9/10   atelectasis  . CT of neck  9/10   enlarged lymph node  . LN R neck  9/10   inconclusive fine needle bx  . squamous cell carcinoma/metastatic     foundin neck 10/10 radical neck dissection  . TSA  1960s   Social History   Tobacco Use  . Smoking status: Former Research scientist (life sciences)  .  Smokeless tobacco: Never Used  . Tobacco comment: quit 20 years ago  Substance Use Topics  . Alcohol use: Yes    Alcohol/week: 0.0 standard drinks    Comment: 1-2 beers per day occas  . Drug use: No   Family History  Problem Relation Age of Onset  . Coronary artery disease Father   . Melanoma Father   . Heart disease Father        CAD  . Cancer Father        melanoma  . Breast cancer Mother    Allergies  Allergen Reactions  . Pneumococcal Vaccine Polyvalent     REACTION: itching for over a year  . Statins     REACTION: myalgia severe   Current Outpatient Medications on File Prior to Visit  Medication Sig Dispense Refill  . aspirin (BAYER ASPIRIN EC LOW DOSE) 81 MG EC tablet Take 81 mg by mouth daily.      Marland Kitchen b complex vitamins capsule Take 1 capsule by mouth daily.    . Cholecalciferol (VITAMIN D3 PO) Take 1,000 Units by mouth daily.    . Cyanocobalamin (B-12 PO) Take 500 mcg by mouth daily.    . enalapril (VASOTEC) 20 MG tablet Take 1 tablet (20 mg total) by mouth daily. 30 tablet 11  . hydrochlorothiazide (HYDRODIURIL) 25 MG tablet Take 1 tablet (25 mg total) by mouth daily. 30 tablet 11  . metoprolol succinate (TOPROL-XL) 100 MG 24 hr tablet Take 1 tablet (100 mg total) by mouth daily. Take with or immediately following a meal. 30 tablet 11  . Potassium 99 MG TABS Take by mouth.    . pyridOXINE (VITAMIN B-6) 100 MG tablet Take 100 mg by mouth daily.    . sildenafil (VIAGRA) 100 MG tablet Take 1 tablet (100 mg total) by mouth daily as needed for erectile dysfunction. 10 tablet 11  . TURMERIC PO Take 1,000 mg by mouth daily.    . vitamin C (ASCORBIC ACID) 500 MG tablet Take 500 mg by mouth daily.     No current facility-administered medications on file prior to visit.    Review of Systems  Constitutional: Negative for activity change, appetite change, chills, diaphoresis, fatigue, fever and unexpected weight change.  HENT: Positive for congestion and rhinorrhea. Negative  for ear discharge, ear pain, postnasal drip, sore throat and trouble swallowing.   Eyes: Positive for discharge, redness and itching. Negative for photophobia, pain and visual disturbance.  Respiratory: Negative for cough, choking, chest tightness, shortness of breath, wheezing and stridor.   Cardiovascular: Negative for chest pain and palpitations.  Gastrointestinal: Negative for abdominal pain, blood in stool, constipation, diarrhea  and nausea.  Genitourinary: Negative for difficulty urinating, dysuria, frequency and urgency.  Musculoskeletal: Negative for arthralgias, joint swelling and myalgias.  Skin: Negative for pallor and rash.  Neurological: Negative for dizziness, tremors, weakness, numbness and headaches.  Endo/Heme/Allergies: Negative for cold intolerance, heat intolerance, polydipsia and adenopathy. Does not bruise/bleed easily.  Psychiatric/Behavioral: Negative for decreased concentration and dysphoric mood. The patient is not nervous/anxious.      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 [facility])    Doing well with routine f/u appt- sees derm and ENT       Conjunctivitis - Primary    L side  Also runny/stuffy nose Some d/c - clear/ crusty in am Sent in polytrim opth solution  Update if not starting to improve in a week or if worsening        Nasal congestion    This may be viral or allergic Improved with claritin D - has to watch bp with  this Also has conjunctivitis- so viral is likely  Recommend nasal saline flonase or nasacort ns as needed Watch for sinus pain/purulent d/c Watch for fever /cough (would need covid testing under those circimstances) Update if not starting to improve in a week or if worsening            Follow Up Instructions: Your pink eye (conjunctivitis) is likely due to a cold, less likely allergies  Use the polytrim eye drops as directed  Update if not starting to improve in a week or if worsening   If any vision change let us know   claritin D may raise BP so watch this  flonase and nasal saline may be helpful for congestion  Watch for sinus pain  Watch for fever/cough or other covid symptoms  For cholesterol  Avoid red meat/ fried foods/ egg yolks/ fatty breakfast meats/ butter, cheese and high fat dairy/ and shellfish     I discussed the assessment and treatment plan with the patient. The patient was provided an opportunity to ask questions and all were answered. The patient agreed with the plan and demonstrated an understanding of the instructions.   The patient was advised to call back or seek an in-person evaluation if the symptoms worsen or if the condition fails to improve as anticipated.     Loura Pardon, MD    Review of Systems  Constitutional: Negative for activity change, appetite change, chills, diaphoresis, fatigue, fever and unexpected weight change.  HENT: Positive for congestion and rhinorrhea. Negative for ear discharge, ear pain, postnasal drip, sore throat and trouble swallowing.   Eyes: Positive for discharge, redness and itching. Negative for photophobia, pain and visual disturbance.  Respiratory: Negative for cough, choking, chest tightness, shortness of breath, wheezing and stridor.   Cardiovascular: Negative for chest pain and palpitations.  Gastrointestinal: Negative for abdominal pain, blood in stool, constipation, diarrhea and nausea.  Endocrine: Negative for  cold intolerance, heat intolerance, polydipsia and polyuria.  Genitourinary: Negative for difficulty urinating, dysuria, frequency and urgency.  Musculoskeletal: Negative for arthralgias, joint swelling and myalgias.  Skin: Negative for pallor and rash.  Neurological: Negative for dizziness, tremors, weakness, numbness and headaches.  Hematological: Negative for adenopathy. Does not bruise/bleed easily.  Psychiatric/Behavioral: Negative for decreased concentration and dysphoric mood. The patient is not nervous/anxious.        Objective:           Assessment & Plan:

## 2018-10-14 NOTE — Assessment & Plan Note (Signed)
L side  Also runny/stuffy nose Some d/c - clear/ crusty in am Sent in polytrim opth solution  Update if not starting to improve in a week or if worsening

## 2018-10-14 NOTE — Assessment & Plan Note (Signed)
Doing well with routine f/u appt- sees derm and ENT

## 2018-10-14 NOTE — Patient Instructions (Signed)
Your pink eye (conjunctivitis) is likely due to a cold, less likely allergies  Use the polytrim eye drops as directed  Update if not starting to improve in a week or if worsening   If any vision change let us know   claritin D may raise BP so watch this  flonase and nasal saline may be helpful for congestion  Watch for sinus pain  Watch for fever/cough or other covid symptoms  For cholesterol  Avoid red meat/ fried foods/ egg yolks/ fatty breakfast meats/ butter, cheese and high fat dairy/ and shellfish

## 2018-10-15 ENCOUNTER — Telehealth: Payer: Self-pay

## 2018-10-15 NOTE — Telephone Encounter (Signed)
Pharmacist from [facility] left v/m that pt was prescribed neomycin-polymyxin-HC ophthalmic suspension which [facility] does not have in stock and pharmacist wants to know if can substitute Neomycin,polymicin,zinc with dexamethasone ophthalmic suspension.Please advise.

## 2018-10-15 NOTE — Telephone Encounter (Signed)
That is fine  thanks

## 2018-10-15 NOTE — Telephone Encounter (Signed)
Pharmacist advised it's okay to use what they have in stock

## 2018-10-30 DIAGNOSIS — L570 Actinic keratosis: Secondary | ICD-10-CM | POA: Diagnosis not present

## 2018-10-30 DIAGNOSIS — X32XXXA Exposure to sunlight, initial encounter: Secondary | ICD-10-CM | POA: Diagnosis not present

## 2018-10-30 DIAGNOSIS — D485 Neoplasm of uncertain behavior of skin: Secondary | ICD-10-CM | POA: Diagnosis not present

## 2018-12-05 ENCOUNTER — Other Ambulatory Visit: Payer: Self-pay | Admitting: Family Medicine

## 2018-12-05 DIAGNOSIS — N529 Male erectile dysfunction, unspecified: Secondary | ICD-10-CM

## 2018-12-06 NOTE — Telephone Encounter (Signed)
Please schedule PE and refill until then  

## 2018-12-06 NOTE — Telephone Encounter (Signed)
Pt cancelled his CPE appt that was scheduled in Feb 2020 and only recent appt was an acute doxy eye issue appt on 10/14/18 and no future appt, please advise

## 2019-01-10 ENCOUNTER — Other Ambulatory Visit: Payer: Self-pay | Admitting: Family Medicine

## 2019-01-22 ENCOUNTER — Other Ambulatory Visit: Payer: Self-pay

## 2019-01-22 ENCOUNTER — Ambulatory Visit (INDEPENDENT_AMBULATORY_CARE_PROVIDER_SITE_OTHER): Payer: BC Managed Care – PPO | Admitting: Family Medicine

## 2019-01-22 ENCOUNTER — Encounter: Payer: Self-pay | Admitting: Family Medicine

## 2019-01-22 VITALS — BP 135/85 | HR 76 | Temp 97.5°F | Ht 70.5 in | Wt 201.0 lb

## 2019-01-22 DIAGNOSIS — R7303 Prediabetes: Secondary | ICD-10-CM

## 2019-01-22 DIAGNOSIS — Z0000 Encounter for general adult medical examination without abnormal findings: Secondary | ICD-10-CM | POA: Diagnosis not present

## 2019-01-22 DIAGNOSIS — N401 Enlarged prostate with lower urinary tract symptoms: Secondary | ICD-10-CM | POA: Diagnosis not present

## 2019-01-22 DIAGNOSIS — N138 Other obstructive and reflux uropathy: Secondary | ICD-10-CM

## 2019-01-22 DIAGNOSIS — E7800 Pure hypercholesterolemia, unspecified: Secondary | ICD-10-CM

## 2019-01-22 DIAGNOSIS — C801 Malignant (primary) neoplasm, unspecified: Secondary | ICD-10-CM

## 2019-01-22 DIAGNOSIS — N529 Male erectile dysfunction, unspecified: Secondary | ICD-10-CM

## 2019-01-22 DIAGNOSIS — C7989 Secondary malignant neoplasm of other specified sites: Secondary | ICD-10-CM

## 2019-01-22 DIAGNOSIS — Z8589 Personal history of malignant neoplasm of other organs and systems: Secondary | ICD-10-CM

## 2019-01-22 DIAGNOSIS — I10 Essential (primary) hypertension: Secondary | ICD-10-CM

## 2019-01-22 LAB — CBC WITH DIFFERENTIAL/PLATELET
Basophils Absolute: 0 K/uL (ref 0.0–0.1)
Basophils Relative: 0.5 % (ref 0.0–3.0)
Eosinophils Absolute: 0 K/uL (ref 0.0–0.7)
Eosinophils Relative: 1 % (ref 0.0–5.0)
HCT: 46.6 % (ref 39.0–52.0)
Hemoglobin: 16 g/dL (ref 13.0–17.0)
Lymphocytes Relative: 17.6 % (ref 12.0–46.0)
Lymphs Abs: 0.8 K/uL (ref 0.7–4.0)
MCHC: 34.4 g/dL (ref 30.0–36.0)
MCV: 89.9 fl (ref 78.0–100.0)
Monocytes Absolute: 0.4 K/uL (ref 0.1–1.0)
Monocytes Relative: 8.6 % (ref 3.0–12.0)
Neutro Abs: 3.3 K/uL (ref 1.4–7.7)
Neutrophils Relative %: 72.3 % (ref 43.0–77.0)
Platelets: 156 K/uL (ref 150.0–400.0)
RBC: 5.19 Mil/uL (ref 4.22–5.81)
RDW: 12.6 % (ref 11.5–15.5)
WBC: 4.5 K/uL (ref 4.0–10.5)

## 2019-01-22 LAB — COMPREHENSIVE METABOLIC PANEL
ALT: 35 U/L (ref 0–53)
AST: 23 U/L (ref 0–37)
Albumin: 4.4 g/dL (ref 3.5–5.2)
Alkaline Phosphatase: 55 U/L (ref 39–117)
BUN: 16 mg/dL (ref 6–23)
CO2: 31 mEq/L (ref 19–32)
Calcium: 9.2 mg/dL (ref 8.4–10.5)
Chloride: 103 mEq/L (ref 96–112)
Creatinine, Ser: 0.84 mg/dL (ref 0.40–1.50)
GFR: 92.61 mL/min (ref >60.00)
Glucose, Bld: 106 mg/dL — ABNORMAL HIGH (ref 70–99)
Potassium: 4.1 mEq/L (ref 3.5–5.1)
Sodium: 140 mEq/L (ref 135–145)
Total Bilirubin: 0.6 mg/dL (ref 0.2–1.2)
Total Protein: 6.4 g/dL (ref 6.0–8.3)

## 2019-01-22 LAB — LIPID PANEL
Cholesterol: 195 mg/dL (ref 0–200)
HDL: 37.6 mg/dL — ABNORMAL LOW (ref >39.00)
LDL Cholesterol: 120 mg/dL — ABNORMAL HIGH (ref 0–99)
NonHDL: 157.47
Total CHOL/HDL Ratio: 5
Triglycerides: 188 mg/dL — ABNORMAL HIGH (ref 0.0–149.0)
VLDL: 37.6 mg/dL (ref 0.0–40.0)

## 2019-01-22 LAB — HEMOGLOBIN A1C: Hgb A1c MFr Bld: 6 % (ref 4.6–6.5)

## 2019-01-22 MED ORDER — HYDROCHLOROTHIAZIDE 25 MG PO TABS: 25.0000 mg | ORAL_TABLET | Freq: Every day | ORAL | 11 refills | Status: DC

## 2019-01-22 MED ORDER — METOPROLOL SUCCINATE ER 100 MG PO TB24: 100.0000 mg | ORAL_TABLET | Freq: Every day | ORAL | 11 refills | Status: DC

## 2019-01-22 MED ORDER — ENALAPRIL MALEATE 20 MG PO TABS: 20.0000 mg | ORAL_TABLET | Freq: Every day | ORAL | 11 refills | Status: DC

## 2019-01-22 MED ORDER — SILDENAFIL CITRATE 100 MG PO TABS: ORAL_TABLET | ORAL | 11 refills | Status: DC

## 2019-01-22 NOTE — Assessment & Plan Note (Signed)
bp in fair control at this time  BP Readings from Last 1 Encounters:  01/22/19 135/85   No changes needed Most recent labs reviewed  Disc lifstyle change with low sodium diet and exercise  Labs ordered

## 2019-01-22 NOTE — Assessment & Plan Note (Signed)
No longer has symptoms No longer sees urology psa added to labs today

## 2019-01-22 NOTE — Progress Notes (Signed)
Subjective:    Patient ID: Terry Lowe, male    DOB: [birth year], 63 y.o.   MRN: [id number]  HPI Here for health maintenance exam and to review chronic medical problems    Feels good physically   Wt Readings from Last 3 Encounters:  01/22/19 201 lb (91.2 kg)  04/10/18 198 lb 8 oz (90 kg)  11/12/17 193 lb 8 oz (87.8 kg)  taking care of himself  Working too much - continues long hours  Very active job-gets most of his exercise at work  28.43 kg/m   Good emotionally  Stressed out -stays that way   Flu vaccine -declines   Colonoscopy 8/17   Tdap 8/18   Prostate health Has BPH Sees urology for this and testosterone def and ED ? Last visit to urology  Has not gone in a long time  No prostate symptoms- occ nocturia  No frequency or urgency     fam hx: father with menanoma and CAD Pt has had squamous cell ca with mets in the past  bp is stable today  No cp or palpitations or headaches or edema  No side effects to medicines  BP Readings from Last 3 Encounters:  01/22/19 132/90  04/10/18 128/88  11/12/17 128/86     Lab Results  Component Value Date   CREATININE 1.04 05/13/2018   BUN 24 (H) 05/13/2018   NA 140 05/13/2018   K 5.0 05/13/2018   CL 104 05/13/2018   CO2 29 05/13/2018   Lab Results  Component Value Date   ALT 33 05/13/2018   AST 24 05/13/2018   ALKPHOS 53 05/13/2018   BILITOT 0.5 05/13/2018    Hyperlipidemia  Lab Results  Component Value Date   CHOL 205 (H) 05/13/2018   HDL 40.20 05/13/2018   LDLCALC 134 (H) 05/13/2018   LDLDIRECT 133.7 09/28/2009   TRIG 151.0 (H) 05/13/2018   CHOLHDL 5 05/13/2018  LDL was up a bit in feb  Diet - is good  Not a lot of junk or fat  Unsure if changed from last time  Intolerant of statins  Avoids fast food  occ fried chicken   Prediabetes Lab Results  Component Value Date   HGBA1C 6.0 05/13/2018  no sweets -never has    Patient Active Problem List   Diagnosis Date Noted  . Conjunctivitis  10/14/2018  . Nasal congestion 10/14/2018  . Right shoulder pain 12/28/2016  . 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 Prediabetes 08/31/2015  . Colon cancer screening 08/30/2015  . Routine general medical examination at a health care facility 08/07/2015  . Erectile dysfunction of organic origin 02/09/2015  . BPH with obstruction/lower urinary tract symptoms 02/09/2015  . Hypogonadism in male 02/08/2015  . Allergic rhinitis 08/21/2014  . History of squamous cell carcinoma 09/28/2009  . Hyperlipidemia 06/03/2007  . 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 TESTOSTERONE DEFICIENCY 06/25/2006  . Essential hypertension 06/25/2006  . CAD 06/25/2006   Past Medical History:  Diagnosis Date  . BPH (benign prostatic hyperplasia)   . CAD (coronary artery disease)   . Erectile dysfunction   . HTN (hypertension)   . Hyperlipidemia   . Hypogonadism in male   . Low testosterone    with replacement  . Squamous cell carcinoma    neck mass- surgery- radical neck dissection/radiation and chemo  . Varicose vein    L lower leg- pain   Past Surgical History:  Procedure Laterality Date  .  Cath (other)  2007   hospital for chest pain  . COLONOSCOPY WITH PROPOFOL N/A 11/22/2015   Procedure: COLONOSCOPY WITH PROPOFOL;  Surgeon: Manya Silvas, MD;  Location: [facility] ENDOSCOPY;  Service: Endoscopy;  Laterality: N/A;  . CT of chest  9/10   atelectasis  . CT of neck  9/10   enlarged lymph node  . LN R neck  9/10   inconclusive fine needle bx  . squamous cell carcinoma/metastatic     foundin neck 10/10 radical neck dissection  . TSA  1960s   Social History   Tobacco Use  . Smoking status: Former Research scientist (life sciences)  . Smokeless tobacco: Never Used  . Tobacco comment: quit 20 years ago  Substance Use Topics  . Alcohol use: Yes    Alcohol/week: 0.0 standard drinks    Comment: 1-2 beers per day occas  . Drug use: No   Family History  Problem Relation Age of Onset   . Coronary artery disease Father   . Melanoma Father   . Heart disease Father        CAD  . Cancer Father        melanoma  . Breast cancer Mother    Allergies  Allergen Reactions  . Pneumococcal Vaccine Polyvalent     REACTION: itching for over a year  . Statins     REACTION: myalgia severe   Current Outpatient Medications on File Prior to Visit  Medication Sig Dispense Refill  . aspirin (BAYER ASPIRIN EC LOW DOSE) 81 MG EC tablet Take 81 mg by mouth daily.      Marland Kitchen b complex vitamins capsule Take 1 capsule by mouth daily.    . Cholecalciferol (VITAMIN D3 PO) Take 1,000 Units by mouth daily.    . Cyanocobalamin (B-12 PO) Take 500 mcg by mouth daily.    . NEOMYCIN-POLYMYXIN-HC, OPHTH, SUSP Apply 1 drop to eye 3 (three) times daily. To affected eye 7.5 mL 0  . Potassium 99 MG TABS Take by mouth.    . pyridOXINE (VITAMIN B-6) 100 MG tablet Take 100 mg by mouth daily.    . TURMERIC PO Take 1,000 mg by mouth daily.    . vitamin C (ASCORBIC ACID) 500 MG tablet Take 500 mg by mouth daily.     No current facility-administered medications on file prior to visit.      Review of Systems  Constitutional: Positive for fatigue. Negative for activity change, appetite change, fever and unexpected weight change.       Fatigue from long work hours   HENT: Negative for congestion, rhinorrhea, sore throat and trouble swallowing.   Eyes: Negative for pain, redness, itching and visual disturbance.  Respiratory: Negative for cough, chest tightness, shortness of breath and wheezing.   Cardiovascular: Negative for chest pain and palpitations.  Gastrointestinal: Negative for abdominal pain, blood in stool, constipation, diarrhea and nausea.  Endocrine: Negative for cold intolerance, heat intolerance, polydipsia and polyuria.  Genitourinary: Negative for difficulty urinating, dysuria, frequency and urgency.  Musculoskeletal: Negative for arthralgias, joint swelling and myalgias.  Skin: Negative for  pallor and rash.  Neurological: Negative for dizziness, tremors, weakness, numbness and headaches.  Hematological: Negative for adenopathy. Does not bruise/bleed easily.  Psychiatric/Behavioral: Negative for decreased concentration and dysphoric mood. The patient is not nervous/anxious.        Objective:   Physical Exam Constitutional:      General: He is not in acute distress.    Appearance: Normal appearance. He is well-developed and  normal weight. He is not ill-appearing or diaphoretic.  HENT:     Head: Normocephalic and atraumatic.     Right Ear: Tympanic membrane, ear canal and external ear normal.     Left Ear: Tympanic membrane, ear canal and external ear normal.     Nose: Nose normal. No congestion.     Mouth/Throat:     Mouth: Mucous membranes are moist.     Pharynx: Oropharynx is clear. No posterior oropharyngeal erythema.     Comments: Baseline surgical changes Eyes:     General: No scleral icterus.       Right eye: No discharge.        Left eye: No discharge.     Conjunctiva/sclera: Conjunctivae normal.     Pupils: Pupils are equal, round, and reactive to light.  Neck:     Musculoskeletal: Normal range of motion and neck supple. No neck rigidity or muscular tenderness.     Thyroid: No thyromegaly.     Vascular: No carotid bruit or JVD.  Cardiovascular:     Rate and Rhythm: Normal rate and regular rhythm.     Pulses: Normal pulses.     Heart sounds: Normal heart sounds. No gallop.   Pulmonary:     Effort: Pulmonary effort is normal. No respiratory distress.     Breath sounds: Normal breath sounds. No wheezing or rales.     Comments: Good air exch Chest:     Chest wall: No tenderness.  Abdominal:     General: Bowel sounds are normal. There is no distension or abdominal bruit.     Palpations: Abdomen is soft. There is no mass.     Tenderness: There is no abdominal tenderness.     Hernia: No hernia is present.  Musculoskeletal:        General: No tenderness.      Right lower leg: No edema.     Left lower leg: No edema.  Lymphadenopathy:     Cervical: No cervical adenopathy.  Skin:    General: Skin is warm and dry.     Coloration: Skin is not pale.     Findings: No erythema or rash.     Comments: sks and aks Tanned in sun exp areas  Lentigines Scars from old derm surgeries   Neurological:     Mental Status: He is alert.     Cranial Nerves: No cranial nerve deficit.     Motor: No abnormal muscle tone.     Coordination: Coordination normal.     Gait: Gait normal.     Deep Tendon Reflexes: Reflexes are normal and symmetric. Reflexes normal.  Psychiatric:        Mood and Affect: Mood normal.        Cognition and Memory: Cognition and memory normal.           Assessment & Plan:   Problem List Items Addressed This Visit      Cardiovascular and Mediastinum   Essential hypertension    bp in fair control at this time  BP Readings from Last 1 Encounters:  01/22/19 135/85   No changes needed Most recent labs reviewed  Disc lifstyle change with low sodium diet and exercise  Labs ordered      Relevant Medications   enalapril (VASOTEC) 20 MG tablet   hydrochlorothiazide (HYDRODIURIL) 25 MG tablet   metoprolol succinate (TOPROL-XL) 100 MG 24 hr tablet   sildenafil (VIAGRA) 100 MG tablet   Other Relevant Orders   CBC  with Differential/Platelet   Comprehensive metabolic panel   Lipid panel   TSH     Genitourinary   BPH with obstruction/lower urinary tract symptoms    No longer has symptoms No longer sees urology psa added to labs today      Relevant Orders   PSA     Other   History of squamous cell carcinoma    Continues regular derm appts Enc strongly to use sunscreen      Hyperlipidemia    Due for labs Disc goals for lipids and reasons to control them Rev last labs with pt Rev low sat fat diet in detail Pt is unsure if diet is much better than last time Intol of statin in the past      Relevant Medications    enalapril (VASOTEC) 20 MG tablet   hydrochlorothiazide (HYDRODIURIL) 25 MG tablet   metoprolol succinate (TOPROL-XL) 100 MG 24 hr tablet   sildenafil (VIAGRA) 100 MG tablet   Other Relevant Orders   Lipid panel   Erectile dysfunction of organic origin    Refill viagra- this works well      Relevant Medications   sildenafil (VIAGRA) 100 MG tablet   Routine general medical examination at a health care facility - Primary    Reviewed health habits including diet and exercise and skin cancer prevention Reviewed appropriate screening tests for age  Also reviewed health mt list, fam hx and immunization status , as well as social and family history   See HPI Labs reviewed Labs done  Declines flu vaccine (claims it made him sick in the past)  Enc sunscreen use and healthy eating       Prediabetes    A1C today disc imp of low glycemic diet and wt loss to prevent DM2       Relevant Orders   Hemoglobin A1c   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

## 2019-01-22 NOTE — Assessment & Plan Note (Signed)
Continues regular derm appts Enc strongly to use sunscreen

## 2019-01-22 NOTE — Assessment & Plan Note (Signed)
Reviewed health habits including diet and exercise and skin cancer prevention Reviewed appropriate screening tests for age  Also reviewed health mt list, fam hx and immunization status , as well as social and family history   See HPI Labs reviewed Labs done  Declines flu vaccine (claims it made him sick in the past)  Enc sunscreen use and healthy eating

## 2019-01-22 NOTE — Assessment & Plan Note (Signed)
Due for labs Disc goals for lipids and reasons to control them Rev last labs with pt Rev low sat fat diet in detail Pt is unsure if diet is much better than last time Intol of statin in the past

## 2019-01-22 NOTE — Assessment & Plan Note (Signed)
A1C today  disc imp of low glycemic diet and wt loss to prevent DM2  

## 2019-01-22 NOTE — Patient Instructions (Signed)
Take care of yourself Continue dermatology visits Wear sun protection  Labs today

## 2019-01-22 NOTE — Assessment & Plan Note (Signed)
Refill viagra- this works well

## 2019-01-22 NOTE — Assessment & Plan Note (Signed)
Continues derm f/u  Doing well  No new developments or clinical changes

## 2019-01-23 LAB — PSA: PSA: 1.77 ng/mL (ref 0.10–4.00)

## 2019-01-23 LAB — TSH: TSH: 2.38 u[IU]/mL (ref 0.35–4.50)

## 2019-01-27 ENCOUNTER — Encounter: Payer: Self-pay | Admitting: *Deleted

## 2019-06-11 DIAGNOSIS — C4441 Basal cell carcinoma of skin of scalp and neck: Secondary | ICD-10-CM | POA: Diagnosis not present

## 2019-06-11 DIAGNOSIS — C44519 Basal cell carcinoma of skin of other part of trunk: Secondary | ICD-10-CM | POA: Diagnosis not present

## 2019-06-11 DIAGNOSIS — D2261 Melanocytic nevi of right upper limb, including shoulder: Secondary | ICD-10-CM | POA: Diagnosis not present

## 2019-06-11 DIAGNOSIS — D224 Melanocytic nevi of scalp and neck: Secondary | ICD-10-CM | POA: Diagnosis not present

## 2019-06-11 DIAGNOSIS — D0439 Carcinoma in situ of skin of other parts of face: Secondary | ICD-10-CM | POA: Diagnosis not present

## 2019-06-11 DIAGNOSIS — L570 Actinic keratosis: Secondary | ICD-10-CM | POA: Diagnosis not present

## 2019-06-11 DIAGNOSIS — D225 Melanocytic nevi of trunk: Secondary | ICD-10-CM | POA: Diagnosis not present

## 2019-06-11 DIAGNOSIS — D485 Neoplasm of uncertain behavior of skin: Secondary | ICD-10-CM | POA: Diagnosis not present

## 2019-06-11 DIAGNOSIS — L821 Other seborrheic keratosis: Secondary | ICD-10-CM | POA: Diagnosis not present

## 2019-06-11 DIAGNOSIS — X32XXXA Exposure to sunlight, initial encounter: Secondary | ICD-10-CM | POA: Diagnosis not present

## 2019-06-23 DIAGNOSIS — D0439 Carcinoma in situ of skin of other parts of face: Secondary | ICD-10-CM | POA: Diagnosis not present

## 2019-06-30 DIAGNOSIS — Z48817 Encounter for surgical aftercare following surgery on the skin and subcutaneous tissue: Secondary | ICD-10-CM | POA: Diagnosis not present

## 2019-06-30 DIAGNOSIS — C4441 Basal cell carcinoma of skin of scalp and neck: Secondary | ICD-10-CM | POA: Diagnosis not present

## 2019-07-08 DIAGNOSIS — L570 Actinic keratosis: Secondary | ICD-10-CM | POA: Diagnosis not present

## 2019-07-08 DIAGNOSIS — X32XXXA Exposure to sunlight, initial encounter: Secondary | ICD-10-CM | POA: Diagnosis not present

## 2019-07-08 DIAGNOSIS — C44519 Basal cell carcinoma of skin of other part of trunk: Secondary | ICD-10-CM | POA: Diagnosis not present

## 2019-07-14 DIAGNOSIS — Z481 Encounter for planned postprocedural wound closure: Secondary | ICD-10-CM | POA: Diagnosis not present

## 2019-10-28 DIAGNOSIS — X32XXXA Exposure to sunlight, initial encounter: Secondary | ICD-10-CM | POA: Diagnosis not present

## 2019-10-28 DIAGNOSIS — C44519 Basal cell carcinoma of skin of other part of trunk: Secondary | ICD-10-CM | POA: Diagnosis not present

## 2019-10-28 DIAGNOSIS — D0422 Carcinoma in situ of skin of left ear and external auricular canal: Secondary | ICD-10-CM | POA: Diagnosis not present

## 2019-10-28 DIAGNOSIS — L570 Actinic keratosis: Secondary | ICD-10-CM | POA: Diagnosis not present

## 2019-10-28 DIAGNOSIS — D485 Neoplasm of uncertain behavior of skin: Secondary | ICD-10-CM | POA: Diagnosis not present

## 2019-10-28 DIAGNOSIS — D2261 Melanocytic nevi of right upper limb, including shoulder: Secondary | ICD-10-CM | POA: Diagnosis not present

## 2019-10-28 DIAGNOSIS — D0439 Carcinoma in situ of skin of other parts of face: Secondary | ICD-10-CM | POA: Diagnosis not present

## 2019-11-17 DIAGNOSIS — L570 Actinic keratosis: Secondary | ICD-10-CM | POA: Diagnosis not present

## 2019-11-17 DIAGNOSIS — D0422 Carcinoma in situ of skin of left ear and external auricular canal: Secondary | ICD-10-CM | POA: Diagnosis not present

## 2019-11-24 DIAGNOSIS — D0439 Carcinoma in situ of skin of other parts of face: Secondary | ICD-10-CM | POA: Diagnosis not present

## 2019-11-24 DIAGNOSIS — R208 Other disturbances of skin sensation: Secondary | ICD-10-CM | POA: Diagnosis not present

## 2019-11-24 DIAGNOSIS — L538 Other specified erythematous conditions: Secondary | ICD-10-CM | POA: Diagnosis not present

## 2019-11-24 DIAGNOSIS — L820 Inflamed seborrheic keratosis: Secondary | ICD-10-CM | POA: Diagnosis not present

## 2019-11-24 DIAGNOSIS — L298 Other pruritus: Secondary | ICD-10-CM | POA: Diagnosis not present

## 2019-11-24 DIAGNOSIS — D485 Neoplasm of uncertain behavior of skin: Secondary | ICD-10-CM | POA: Diagnosis not present

## 2019-12-08 DIAGNOSIS — Z48817 Encounter for surgical aftercare following surgery on the skin and subcutaneous tissue: Secondary | ICD-10-CM | POA: Diagnosis not present

## 2019-12-22 DIAGNOSIS — D485 Neoplasm of uncertain behavior of skin: Secondary | ICD-10-CM | POA: Diagnosis not present

## 2019-12-22 DIAGNOSIS — L570 Actinic keratosis: Secondary | ICD-10-CM | POA: Diagnosis not present

## 2019-12-22 DIAGNOSIS — D0439 Carcinoma in situ of skin of other parts of face: Secondary | ICD-10-CM | POA: Diagnosis not present

## 2019-12-31 DIAGNOSIS — Z48817 Encounter for surgical aftercare following surgery on the skin and subcutaneous tissue: Secondary | ICD-10-CM | POA: Diagnosis not present

## 2019-12-31 DIAGNOSIS — L570 Actinic keratosis: Secondary | ICD-10-CM | POA: Diagnosis not present

## 2020-01-28 ENCOUNTER — Other Ambulatory Visit: Payer: Self-pay

## 2020-01-28 ENCOUNTER — Ambulatory Visit: Payer: BC Managed Care – PPO | Admitting: Family Medicine

## 2020-01-28 ENCOUNTER — Encounter: Payer: Self-pay | Admitting: Family Medicine

## 2020-01-28 VITALS — BP 150/90 | HR 73 | Temp 96.5°F | Ht 70.0 in | Wt 195.6 lb

## 2020-01-28 DIAGNOSIS — I10 Essential (primary) hypertension: Secondary | ICD-10-CM

## 2020-01-28 DIAGNOSIS — R7303 Prediabetes: Secondary | ICD-10-CM | POA: Diagnosis not present

## 2020-01-28 DIAGNOSIS — N529 Male erectile dysfunction, unspecified: Secondary | ICD-10-CM

## 2020-01-28 DIAGNOSIS — E7800 Pure hypercholesterolemia, unspecified: Secondary | ICD-10-CM

## 2020-01-28 LAB — CBC WITH DIFFERENTIAL/PLATELET
Basophils Absolute: 0 10*3/uL (ref 0.0–0.1)
Basophils Relative: 0.4 % (ref 0.0–3.0)
Eosinophils Absolute: 0.2 10*3/uL (ref 0.0–0.7)
Eosinophils Relative: 3.7 % (ref 0.0–5.0)
HCT: 45.9 % (ref 39.0–52.0)
Hemoglobin: 15.7 g/dL (ref 13.0–17.0)
Lymphocytes Relative: 19.6 % (ref 12.0–46.0)
Lymphs Abs: 1 10*3/uL (ref 0.7–4.0)
MCHC: 34.2 g/dL (ref 30.0–36.0)
MCV: 89.2 fl (ref 78.0–100.0)
Monocytes Absolute: 0.4 10*3/uL (ref 0.1–1.0)
Monocytes Relative: 8 % (ref 3.0–12.0)
Neutro Abs: 3.5 10*3/uL (ref 1.4–7.7)
Neutrophils Relative %: 68.3 % (ref 43.0–77.0)
Platelets: 174 10*3/uL (ref 150.0–400.0)
RBC: 5.14 Mil/uL (ref 4.22–5.81)
RDW: 12.9 % (ref 11.5–15.5)
WBC: 5.1 10*3/uL (ref 4.0–10.5)

## 2020-01-28 LAB — LIPID PANEL
Cholesterol: 186 mg/dL (ref 0–200)
HDL: 38.7 mg/dL — ABNORMAL LOW (ref >39.00)
LDL Cholesterol: 113 mg/dL — ABNORMAL HIGH (ref 0–99)
NonHDL: 147.21
Total CHOL/HDL Ratio: 5
Triglycerides: 173 mg/dL — ABNORMAL HIGH (ref 0.0–149.0)
VLDL: 34.6 mg/dL (ref 0.0–40.0)

## 2020-01-28 LAB — COMPREHENSIVE METABOLIC PANEL
ALT: 24 U/L (ref 0–53)
AST: 20 U/L (ref 0–37)
Albumin: 4.1 g/dL (ref 3.5–5.2)
Alkaline Phosphatase: 62 U/L (ref 39–117)
BUN: 16 mg/dL (ref 6–23)
CO2: 33 mEq/L — ABNORMAL HIGH (ref 19–32)
Calcium: 9 mg/dL (ref 8.4–10.5)
Chloride: 102 mEq/L (ref 96–112)
Creatinine, Ser: 0.78 mg/dL (ref 0.40–1.50)
GFR: 95.29 mL/min (ref >60.00)
Glucose, Bld: 100 mg/dL — ABNORMAL HIGH (ref 70–99)
Potassium: 4.1 mEq/L (ref 3.5–5.1)
Sodium: 140 mEq/L (ref 135–145)
Total Bilirubin: 0.5 mg/dL (ref 0.2–1.2)
Total Protein: 6.1 g/dL (ref 6.0–8.3)

## 2020-01-28 LAB — TSH: TSH: 2.62 u[IU]/mL (ref 0.35–4.50)

## 2020-01-28 LAB — HEMOGLOBIN A1C: Hgb A1c MFr Bld: 6.4 % (ref 4.6–6.5)

## 2020-01-28 MED ORDER — METOPROLOL SUCCINATE ER 100 MG PO TB24: 100.0000 mg | ORAL_TABLET | Freq: Every day | ORAL | 11 refills | Status: AC

## 2020-01-28 MED ORDER — HYDROCHLOROTHIAZIDE 25 MG PO TABS
25.0000 mg | ORAL_TABLET | Freq: Every day | ORAL | 0 refills | Status: DC
Start: 2020-01-28 — End: 2020-02-27

## 2020-01-28 MED ORDER — SILDENAFIL CITRATE 100 MG PO TABS: ORAL_TABLET | ORAL | 11 refills | Status: DC

## 2020-01-28 MED ORDER — ENALAPRIL MALEATE 20 MG PO TABS: 20.0000 mg | ORAL_TABLET | Freq: Every day | ORAL | 11 refills | Status: AC

## 2020-01-28 MED ORDER — AMLODIPINE BESYLATE 5 MG PO TABS: 5.0000 mg | ORAL_TABLET | Freq: Every day | ORAL | 3 refills | Status: AC

## 2020-01-28 NOTE — Assessment & Plan Note (Signed)
Lipid panel drawn today Disc goals for lipids and reasons to control them Rev last labs with pt Rev low sat fat diet in detail Pt states diet is pretty good/unchanged  He does avoid fatty food

## 2020-01-28 NOTE — Patient Instructions (Addendum)
Continue current medications Start amlodipine  If you have any side effects please let me know   Then after 2 weeks- let us know how your blood pressure is running  At that time we will decide weather to stop the hctz or replace it with something else   Labs today   Take care of yourself

## 2020-01-28 NOTE — Assessment & Plan Note (Signed)
Takes sildenafil prn

## 2020-01-28 NOTE — Progress Notes (Signed)
Subjective:    Patient ID: Terry Lowe, male    DOB: 01-16-1956, 64 y.o.   MRN: [id number]  This visit occurred during the SARS-CoV-2 public health emergency.  Safety protocols were in place, including screening questions prior to the visit, additional usage of staff PPE, and extensive cleaning of exam room while observing appropriate contact time as indicated for disinfecting solutions.    HPI Pt presents for f/u of chronic health problems   Wt Readings from Last 3 Encounters:  01/28/20 195 lb 9.6 oz (88.7 kg)  01/22/19 201 lb (91.2 kg)  04/10/18 198 lb 8 oz (90 kg)   28.07 kg/m   Feeling ok  bp stays high a lot    HTN bp is stable today  No cp or palpitations or headaches or edema  No side effects to medicines  BP Readings from Last 3 Encounters:  01/28/20 (!) 160/84  01/22/19 135/85  04/10/18 128/88    bp fluctuates from day to day  At home 160s /80s usually   180/92 at the dentist  Gave him nitrous oxide and bp went down  He has noticed tinnitus     Pt takes enalapril 20 mg daily  hctz 25 mg daily  Metoprolol xl 100 mg daily  No trouble with medicines   His dermatologist wants him to stop hctz due to risk of skin cancer (he has had many)   Pulse Readings from Last 3 Encounters:  01/28/20 73  01/22/19 76  04/10/18 73   Diet is ok  Tries not to eat salty food  Active job - so gets lots of exercise  No cp or ankle swelling     Not taking any testosterone right now   Lab Results  Component Value Date   CREATININE 0.84 01/22/2019   BUN 16 01/22/2019   NA 140 01/22/2019   K 4.1 01/22/2019   CL 103 01/22/2019   CO2 31 01/22/2019      Hyperlipidemia  Lab Results  Component Value Date   CHOL 195 01/22/2019   HDL 37.60 (L) 01/22/2019   LDLCALC 120 (H) 01/22/2019   LDLDIRECT 133.7 09/28/2009   TRIG 188.0 (H) 01/22/2019   CHOLHDL 5 01/22/2019   Due for labs  Intolerant of statin in the past   Prediabetes Lab Results  Component  Value Date   HGBA1C 6.0 01/22/2019     Patient Active Problem List   Diagnosis Date Noted  . Conjunctivitis 10/14/2018  . Nasal congestion 10/14/2018  . Right shoulder pain 12/28/2016  . 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 Prediabetes 08/31/2015  . Colon cancer screening 08/30/2015  . Routine general medical examination at a health care facility 08/07/2015  . Erectile dysfunction of organic origin 02/09/2015  . BPH with obstruction/lower urinary tract symptoms 02/09/2015  . Hypogonadism in male 02/08/2015  . Allergic rhinitis 08/21/2014  . History of squamous cell carcinoma 09/28/2009  . Hyperlipidemia 06/03/2007  . 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 TESTOSTERONE DEFICIENCY 06/25/2006  . Essential hypertension 06/25/2006  . CAD 06/25/2006   Past Medical History:  Diagnosis Date  . BPH (benign prostatic hyperplasia)   . CAD (coronary artery disease)   . Erectile dysfunction   . HTN (hypertension)   . Hyperlipidemia   . Hypogonadism in male   . Low testosterone    with replacement  . Squamous cell carcinoma    neck mass- surgery- radical neck dissection/radiation and chemo  .  Varicose vein    L lower leg- pain   Past Surgical History:  Procedure Laterality Date  . Cath (other)  2007   hospital for chest pain  . COLONOSCOPY WITH PROPOFOL N/A 11/22/2015   Procedure: COLONOSCOPY WITH PROPOFOL;  Surgeon: Manya Silvas, MD;  Location: [facility] ENDOSCOPY;  Service: Endoscopy;  Laterality: N/A;  . CT of chest  9/10   atelectasis  . CT of neck  9/10   enlarged lymph node  . LN R neck  9/10   inconclusive fine needle bx  . squamous cell carcinoma/metastatic     foundin neck 10/10 radical neck dissection  . TSA  1960s   Social History   Tobacco Use  . Smoking status: Former Research scientist (life sciences)  . Smokeless tobacco: Never Used  . Tobacco comment: quit 20 years ago  Substance Use Topics  . Alcohol use: Yes    Alcohol/week: 0.0  standard drinks    Comment: 1-2 beers per day occas  . Drug use: No   Family History  Problem Relation Age of Onset  . Coronary artery disease Father   . Melanoma Father   . Heart disease Father        CAD  . Cancer Father        melanoma  . Breast cancer Mother    Allergies  Allergen Reactions  . Pneumococcal Vaccine Polyvalent     REACTION: itching for over a year  . Statins     REACTION: myalgia severe   Current Outpatient Medications on File Prior to Visit  Medication Sig Dispense Refill  . aspirin (BAYER ASPIRIN EC LOW DOSE) 81 MG EC tablet Take 81 mg by mouth daily.      Marland Kitchen b complex vitamins capsule Take 1 capsule by mouth daily.    . Cholecalciferol (VITAMIN D3 PO) Take 1,000 Units by mouth daily.    . Cyanocobalamin (B-12 PO) Take 500 mcg by mouth daily.    . Potassium 99 MG TABS Take by mouth.    . pyridOXINE (VITAMIN B-6) 100 MG tablet Take 100 mg by mouth daily.    . TURMERIC PO Take 1,000 mg by mouth daily.    . vitamin C (ASCORBIC ACID) 500 MG tablet Take 500 mg by mouth daily.    . vitamin E (VITAMIN E) 180 MG (400 UNITS) capsule Take 400 Units by mouth daily.    Marland Kitchen zinc gluconate 50 MG tablet Take 50 mg by mouth daily.    . NEOMYCIN-POLYMYXIN-HC, OPHTH, SUSP Apply 1 drop to eye 3 (three) times daily. To affected eye (Patient not taking: Reported on 01/28/2020) 7.5 mL 0   No current facility-administered medications on file prior to visit.    Review of Systems  Constitutional: Positive for fatigue. Negative for activity change, appetite change, fever and unexpected weight change.       Not a lot of time for self care with his current job  Is frustrated  HENT: Negative for congestion, rhinorrhea, sore throat and trouble swallowing.   Eyes: Negative for pain, redness, itching and visual disturbance.  Respiratory: Negative for cough, chest tightness, shortness of breath and wheezing.   Cardiovascular: Negative for chest pain and palpitations.  Gastrointestinal:  Negative for abdominal pain, blood in stool, constipation, diarrhea and nausea.  Endocrine: Negative for cold intolerance, heat intolerance, polydipsia and polyuria.  Genitourinary: Negative for difficulty urinating, dysuria, frequency and urgency.  Musculoskeletal: Negative for arthralgias, joint swelling and myalgias.  Skin: Negative for pallor and rash.  Neurological: Negative for dizziness, tremors, weakness, numbness and headaches.  Hematological: Negative for adenopathy. Does not bruise/bleed easily.  Psychiatric/Behavioral: Negative for decreased concentration and dysphoric mood. The patient is not nervous/anxious.        Objective:   Physical Exam Constitutional:      General: He is not in acute distress.    Appearance: Normal appearance. He is well-developed and normal weight. He is not ill-appearing or diaphoretic.  HENT:     Head: Normocephalic and atraumatic.  Eyes:     General: No scleral icterus.    Conjunctiva/sclera: Conjunctivae normal.     Pupils: Pupils are equal, round, and reactive to light.  Neck:     Thyroid: No thyromegaly.     Vascular: No carotid bruit or JVD.  Cardiovascular:     Rate and Rhythm: Normal rate and regular rhythm.     Pulses: Normal pulses.     Heart sounds: Normal heart sounds. No gallop.   Pulmonary:     Effort: Pulmonary effort is normal. No respiratory distress.     Breath sounds: Normal breath sounds. No wheezing or rales.  Abdominal:     General: Bowel sounds are normal. There is no distension or abdominal bruit.     Palpations: Abdomen is soft. There is no mass.     Tenderness: There is no abdominal tenderness.  Musculoskeletal:     Cervical back: Normal range of motion and neck supple. No tenderness.  Lymphadenopathy:     Cervical: No cervical adenopathy.  Skin:    General: Skin is warm and dry.     Findings: No rash.     Comments: Lentigines and solar damage Scars from treated skin skin cancers   Neurological:     Mental  Status: He is alert.     Cranial Nerves: No cranial nerve deficit.     Coordination: Coordination normal.     Deep Tendon Reflexes: Reflexes are normal and symmetric. Reflexes normal.  Psychiatric:        Mood and Affect: Mood normal.     Comments: Pleasant  Voices frustration re: work schedule            Assessment & Plan:   Problem List Items Addressed This Visit      Cardiovascular and Mediastinum   Essential hypertension - Primary    BP: (!) 150/90    Pt is not well controlled this visit or at home  His tinnitus may worsen with inc bp  Currently taking enalapril 20 mg daily, hctz 25 mg daily and metoprolol xl 100 mg daily (with pulse in 60s)  In addition to this-his dermatologist wants him off hctz due to risk of skin cancer (he does wear sun protection)  Disc opt for tx  Will add amlodipine 5 mg daily and have pt report back with bp readings at home in about 2 weeks  At that time will make plan to change hctz depending on his response inst to call if side effects or problems Also discussed lifestyle change for bp and given DASH eating handout       Relevant Medications   amLODipine (NORVASC) 5 MG tablet   metoprolol succinate (TOPROL-XL) 100 MG 24 hr tablet   enalapril (VASOTEC) 20 MG tablet   hydrochlorothiazide (HYDRODIURIL) 25 MG tablet   sildenafil (VIAGRA) 100 MG tablet   Other Relevant Orders   CBC with Differential/Platelet   Comprehensive metabolic panel   Lipid panel   TSH     Other  Hyperlipidemia    Lipid panel drawn today Disc goals for lipids and reasons to control them Rev last labs with pt Rev low sat fat diet in detail Pt states diet is pretty good/unchanged  He does avoid fatty food      Relevant Medications   amLODipine (NORVASC) 5 MG tablet   metoprolol succinate (TOPROL-XL) 100 MG 24 hr tablet   enalapril (VASOTEC) 20 MG tablet   hydrochlorothiazide (HYDRODIURIL) 25 MG tablet   sildenafil (VIAGRA) 100 MG tablet   Other Relevant  Orders   Lipid panel   Erectile dysfunction of organic origin    Takes sildenafil prn      Relevant Medications   sildenafil (VIAGRA) 100 MG tablet   Prediabetes    A1C today  Per pt diet is pretty good disc imp of low glycemic diet and wt loss to prevent DM2       Relevant Orders   Hemoglobin A1c

## 2020-01-28 NOTE — Assessment & Plan Note (Signed)
A1C today  Per pt diet is pretty good disc imp of low glycemic diet and wt loss to prevent DM2

## 2020-01-28 NOTE — Assessment & Plan Note (Addendum)
BP: (!) 150/90    Pt is not well controlled this visit or at home  His tinnitus may worsen with inc bp  Currently taking enalapril 20 mg daily, hctz 25 mg daily and metoprolol xl 100 mg daily (with pulse in 60s)  In addition to this-his dermatologist wants him off hctz due to risk of skin cancer (he does wear sun protection)  Disc opt for tx  Will add amlodipine 5 mg daily and have pt report back with bp readings at home in about 2 weeks  At that time will make plan to change hctz depending on his response inst to call if side effects or problems Also discussed lifestyle change for bp and given DASH eating handout

## 2020-01-29 ENCOUNTER — Encounter: Payer: Self-pay | Admitting: *Deleted

## 2020-02-27 ENCOUNTER — Other Ambulatory Visit: Payer: Self-pay | Admitting: Family Medicine

## 2020-08-19 ENCOUNTER — Other Ambulatory Visit: Payer: Self-pay | Admitting: Family Medicine

## 2020-11-27 ENCOUNTER — Other Ambulatory Visit: Payer: Self-pay | Admitting: Family Medicine

## 2020-12-31 ENCOUNTER — Other Ambulatory Visit: Payer: Self-pay | Admitting: Family Medicine

## 2020-12-31 NOTE — Telephone Encounter (Signed)
Called patient and he stated that he will call back to schedule

## 2020-12-31 NOTE — Telephone Encounter (Signed)
Pt hasn't been seen in almost a year. Please schedule a AWV/CPE or at least a f/u and send back to me to refill med

## 2021-01-19 ENCOUNTER — Ambulatory Visit
Admission: RE | Admit: 2021-01-19 | Discharge: 2021-01-19 | Disposition: A | Discharge status: Home / Self Care | Payer: No Typology Code available for payment source | Source: Ambulatory Visit | Attending: Urology | Admitting: Urology

## 2021-01-19 ENCOUNTER — Encounter: Payer: Self-pay | Admitting: Urology

## 2021-01-19 ENCOUNTER — Ambulatory Visit (INDEPENDENT_AMBULATORY_CARE_PROVIDER_SITE_OTHER): Payer: BLUE CROSS/BLUE SHIELD | Admitting: Urology

## 2021-01-19 ENCOUNTER — Other Ambulatory Visit: Payer: Self-pay

## 2021-01-19 VITALS — BP 163/95 | HR 82 | Ht 70.0 in | Wt 195.0 lb

## 2021-01-19 DIAGNOSIS — Z87442 Personal history of urinary calculi: Secondary | ICD-10-CM | POA: Diagnosis present

## 2021-01-19 DIAGNOSIS — R319 Hematuria, unspecified: Secondary | ICD-10-CM | POA: Insufficient documentation

## 2021-01-19 DIAGNOSIS — N200 Calculus of kidney: Secondary | ICD-10-CM

## 2021-01-19 LAB — URINALYSIS, COMPLETE
Bilirubin, UA: NEGATIVE
Glucose, UA: NEGATIVE
Ketones, UA: NEGATIVE
Leukocytes,UA: NEGATIVE
Nitrite, UA: NEGATIVE
Protein,UA: NEGATIVE
RBC, UA: NEGATIVE
Specific Gravity, UA: 1.015 (ref 1.005–1.030)
Urobilinogen, Ur: 0.2 mg/dL (ref 0.2–1.0)
pH, UA: 6 (ref 5.0–7.5)

## 2021-01-19 NOTE — Patient Instructions (Signed)

## 2021-01-19 NOTE — Progress Notes (Signed)
01/19/21 3:20 PM   [name] 1956-02-01 [id number]  Referring provider:  Abner Greenspan, MD [address] [place],  [place] [address] Chief Complaint  Patient presents with   Nephrolithiasis     HPI: Terry Lowe is a 65 y.o.male who presents today for further evaluation of possible kidney stone.   He has a very remote history of kidney stones and had a known right lower pole stone on PET scan dating back to 2012.  He reports that he experienced burning and pain 2 days ago. He saw blood in his urine this morning at the tip of his penis.  Denies any associated flank pain.  No frequency or urgency.  This is not consistent with his previous stone episodes.  Reports this afternoon, he is urinating normally without bladder pain.  KUB was negative. Urine today was negative.    PMH: Past Medical History:  Diagnosis Date   BPH (benign prostatic hyperplasia)    CAD (coronary artery disease)    Erectile dysfunction    HTN (hypertension)    Hyperlipidemia    Hypogonadism in male    Low testosterone    with replacement   Squamous cell carcinoma    neck mass- surgery- radical neck dissection/radiation and chemo   Varicose vein    L lower leg- pain    Surgical History: Past Surgical History:  Procedure Laterality Date   Cath (other)  2007   hospital for chest pain   COLONOSCOPY WITH PROPOFOL N/A 11/22/2015   Procedure: COLONOSCOPY WITH PROPOFOL;  Surgeon: Manya Silvas, MD;  Location: [facility];  Service: Endoscopy;  Laterality: N/A;   CT of chest  9/10   atelectasis   CT of neck  9/10   enlarged lymph node   LN R neck  9/10   inconclusive fine needle bx   squamous cell carcinoma/metastatic     foundin neck 10/10 radical neck dissection   TSA  1960s    Home Medications:  Allergies as of 01/19/2021       Reactions   Pneumococcal Vaccine Polyvalent    REACTION: itching for over a year   Statins    REACTION: myalgia severe         Medication List        Accurate as of January 19, 2021  3:20 PM. If you have any questions, ask your nurse or doctor.          amLODipine 5 MG tablet Commonly known as: NORVASC Take 1 tablet (5 mg total) by mouth daily.   aspirin 81 MG EC tablet Take 81 mg by mouth daily.   b complex vitamins capsule Take 1 capsule by mouth daily.   B-12 PO Take 500 mcg by mouth daily.   enalapril 20 MG tablet Commonly known as: VASOTEC Take 1 tablet (20 mg total) by mouth daily.   hydrochlorothiazide 25 MG tablet Commonly known as: HYDRODIURIL TAKE 1 TABLET BY MOUTH ONCE DAILY   metoprolol succinate 100 MG 24 hr tablet Commonly known as: TOPROL-XL Take 1 tablet (100 mg total) by mouth daily. Take with or immediately following a meal.   NEOMYCIN-POLYMYXIN-HC (OPHTH) Susp Apply 1 drop to eye 3 (three) times daily. To affected eye   Potassium 99 MG Tabs Take by mouth.   pyridOXINE 100 MG tablet Commonly known as: VITAMIN B-6 Take 100 mg by mouth daily.   sildenafil 100 MG tablet Commonly known as: VIAGRA TAKE 1 TABLET BY MOUTH ONCE  DAILY AS NEEDED FOR ERECTILE DYSFUNCTION   TURMERIC PO Take 1,000 mg by mouth daily.   vitamin C 500 MG tablet Commonly known as: ASCORBIC ACID Take 500 mg by mouth daily.   VITAMIN D3 PO Take 1,000 Units by mouth daily.   vitamin E 180 MG (400 UNITS) capsule Take 400 Units by mouth daily.   zinc gluconate 50 MG tablet Take 50 mg by mouth daily.        Allergies:  Allergies  Allergen Reactions   Pneumococcal Vaccine Polyvalent     REACTION: itching for over a year   Statins     REACTION: myalgia severe    Family History: Family History  Problem Relation Age of Onset   Coronary artery disease Father    Melanoma Father    Heart disease Father        CAD   Cancer Father        melanoma   Breast cancer Mother     Social History:  reports that he has quit smoking. He has never used smokeless tobacco. He reports current  alcohol use. He reports that he does not use drugs.   Physical Exam: BP (!) 163/95   Pulse 82   Ht 5\' 10"  (1.778 m)   Wt 195 lb (88.5 kg)   BMI 27.98 kg/m   Constitutional:  Alert and oriented, No acute distress. HEENT: [place] AT, moist mucus membranes.  Trachea midline, no masses. Cardiovascular: No clubbing, cyanosis, or edema. Respiratory: Normal respiratory effort, no increased work of breathing. Skin: No rashes, bruises or suspicious lesions. Neurologic: Grossly intact, no focal deficits, moving all 4 extremities. Psychiatric: Normal mood and affect.  Laboratory Data:  Lab Results  Component Value Date   CREATININE 0.78 01/28/2020    Lab Results  Component Value Date   PSA 1.77 01/22/2019   KUB was personally reviewed today.  No obvious stones overlying the renal shadows or ureteral calculi appreciated.  Awaiting final radiologic interpretation.  Assessment & Plan:    Pain during urination / Gross hematuria -We discussed today that his symptoms are not particularly consistent with stone episode, blood at the tip of the penis with burning after urination is not likely a stone event  -KUB today without any obvious stone burden  -UA today also shows no microscopic blood which is somewhat curious given that he had this episode just this morning  -No evidence of infection is contributing factor, rightly he is asymptomatic  -Would recommend modified hematuria evaluation with cystoscopy and renal ultrasound to rule out any additional underlying pathology, he is agreeable this plan  -Call if symptoms worsen, low threshold to change imaging to CT urogram if symptoms worsen or hematuria recurs   Follow-up with RUS results / cysto  I,Kailey Littlejohn,acting as a scribe for [name], MD.,have documented all relevant documentation on the behalf of [name], MD,as directed by  [name], MD while in the presence of [name], MD.  I have reviewed the above  documentation for accuracy and completeness, and I agree with the above.   [name], MD   [facility] [address], [place], [place] [address] [phone]

## 2021-01-28 ENCOUNTER — Telehealth: Payer: Self-pay

## 2021-01-28 NOTE — Telephone Encounter (Signed)
I called pt to reschedule cystoscopy appointment. Pt states he feels better and does not want to proceed with RUS or cystoscopy

## 2021-01-30 ENCOUNTER — Other Ambulatory Visit: Payer: Self-pay | Admitting: Family Medicine

## 2021-01-31 NOTE — Telephone Encounter (Signed)
Left message for pt to call office

## 2021-01-31 NOTE — Telephone Encounter (Signed)
Please let him know that I highly recommend that he proceed especially given his smoking history.  There is no other way to rule out bladder cancer or other underlying pathology.  Please ensure that he understands and is able to verbalize that pathology including bladder cancer can be missed if he elects not to pursue this.  [name], MD

## 2021-02-01 NOTE — Telephone Encounter (Signed)
Please call patient and schedule annual physical. Send request request back to [facility] for refill after appointment is scheduled.

## 2021-02-02 ENCOUNTER — Other Ambulatory Visit: Payer: Self-pay | Admitting: Urology

## 2021-02-02 NOTE — Telephone Encounter (Signed)
Called patient and stated that he go his CPE done with Amaryllis Dyke and they refilled his medication

## 2021-02-09 ENCOUNTER — Other Ambulatory Visit: Payer: Self-pay | Admitting: Urology

## 2021-09-02 ENCOUNTER — Other Ambulatory Visit: Payer: Self-pay | Admitting: Family Medicine

## 2022-05-25 NOTE — Progress Notes (Deleted)
05/26/2022 4:50 PM   Terry Lowe 29-Oct-1955 [id number]  Referring provider: No referring provider defined for this encounter.  Urological history: 1.  Erectile dysfunction -contributing factors of ED are HTN, BPH, CAD, HLD, age and former smoker.   2. BPH w/ LU TS -PSA (11/2021) 1.86  No chief complaint on file.   HPI: Terry Lowe is a 67 y.o. male who presents today for testosterone issue.   PMH: Past Medical History:  Diagnosis Date   BPH (benign prostatic hyperplasia)    CAD (coronary artery disease)    Erectile dysfunction    HTN (hypertension)    Hyperlipidemia    Hypogonadism in male    Low testosterone    with replacement   Squamous cell carcinoma    neck mass- surgery- radical neck dissection/radiation and chemo   Varicose vein    L lower leg- pain    Surgical History: Past Surgical History:  Procedure Laterality Date   Cath (other)  2007   hospital for chest pain   COLONOSCOPY WITH PROPOFOL N/A 11/22/2015   Procedure: COLONOSCOPY WITH PROPOFOL;  Surgeon: Manya Silvas, MD;  Location: [facility];  Service: Endoscopy;  Laterality: N/A;   CT of chest  9/10   atelectasis   CT of neck  9/10   enlarged lymph node   LN R neck  9/10   inconclusive fine needle bx   squamous cell carcinoma/metastatic     foundin neck 10/10 radical neck dissection   TSA  1960s    Home Medications:  Allergies as of 05/26/2022       Reactions   Pneumococcal Vaccine Polyvalent    REACTION: itching for over a year   Statins    REACTION: myalgia severe        Medication List        Accurate as of May 25, 2022  4:50 PM. If you have any questions, ask your nurse or doctor.          amLODipine 5 MG tablet Commonly known as: NORVASC Take 1 tablet (5 mg total) by mouth daily.   ascorbic acid 500 MG tablet Commonly known as: VITAMIN C Take 500 mg by mouth daily.   aspirin EC 81 MG tablet Take 81 mg by mouth daily.   b complex vitamins  capsule Take 1 capsule by mouth daily.   B-12 PO Take 500 mcg by mouth daily.   enalapril 20 MG tablet Commonly known as: VASOTEC Take 1 tablet (20 mg total) by mouth daily.   hydrochlorothiazide 25 MG tablet Commonly known as: HYDRODIURIL TAKE 1 TABLET BY MOUTH ONCE DAILY   metoprolol succinate 100 MG 24 hr tablet Commonly known as: TOPROL-XL Take 1 tablet (100 mg total) by mouth daily. Take with or immediately following a meal.   NEOMYCIN-POLYMYXIN-HC (OPHTH) Susp Apply 1 drop to eye 3 (three) times daily. To affected eye   Potassium 99 MG Tabs Take by mouth.   pyridOXINE 100 MG tablet Commonly known as: VITAMIN B6 Take 100 mg by mouth daily.   sildenafil 100 MG tablet Commonly known as: VIAGRA TAKE 1 TABLET BY MOUTH ONCE DAILY AS NEEDED FOR ERECTILE DYSFUNCTION   TURMERIC PO Take 1,000 mg by mouth daily.   VITAMIN D3 PO Take 1,000 Units by mouth daily.   vitamin E 180 MG (400 UNITS) capsule Take 400 Units by mouth daily.   zinc gluconate 50 MG tablet Take 50 mg by mouth daily.  Allergies:  Allergies  Allergen Reactions   Pneumococcal Vaccine Polyvalent     REACTION: itching for over a year   Statins     REACTION: myalgia severe    Family History: Family History  Problem Relation Age of Onset   Coronary artery disease Father    Melanoma Father    Heart disease Father        CAD   Cancer Father        melanoma   Breast cancer Mother     Social History:  reports that he has quit smoking. He has never used smokeless tobacco. He reports current alcohol use. He reports that he does not use drugs.  ROS: Pertinent ROS in HPI  Physical Exam: There were no vitals taken for this visit.  Constitutional:  Well nourished. Alert and oriented, No acute distress. HEENT: [place] AT, moist mucus membranes.  Trachea midline, no masses. Cardiovascular: No clubbing, cyanosis, or edema. Respiratory: Normal respiratory effort, no increased work of  breathing. GI: Abdomen is soft, non tender, non distended, no abdominal masses. Liver and spleen not palpable.  No hernias appreciated.  Stool sample for occult testing is not indicated.   GU: No CVA tenderness.  No bladder fullness or masses.  Patient with circumcised/uncircumcised phallus. ***Foreskin easily retracted***  Urethral meatus is patent.  No penile discharge. No penile lesions or rashes. Scrotum without lesions, cysts, rashes and/or edema.  Testicles are located scrotally bilaterally. No masses are appreciated in the testicles. Left and right epididymis are normal. Rectal: Patient with  normal sphincter tone. Anus and perineum without scarring or rashes. No rectal masses are appreciated. Prostate is approximately *** grams, *** nodules are appreciated. Seminal vesicles are normal. Skin: No rashes, bruises or suspicious lesions. Lymph: No cervical or inguinal adenopathy. Neurologic: Grossly intact, no focal deficits, moving all 4 extremities. Psychiatric: Normal mood and affect.  Laboratory Data: N/A  Pertinent Imaging: N/A  Assessment & Plan:  ***  1. Testosterone deficiency -Near castrate testosterone levels*** -Significant symptoms*** -Recommend starting TRT*** -We discussed the most common forms of replacement including intramuscular injection and gels and he desires to start injections*** -Rx testosterone cypionate-200 mg every 2 weeks to start*** -Appointment will be made for injection training*** -Follow-up 6 weeks after starting TRT for testosterone level and symptom check -Potential side effects of testosterone replacement were discussed including stimulation of benign prostatic growth with lower urinary tract symptoms; erythrocytosis; edema; gynecomastia; worsening sleep apnea; venous thromboembolism; testicular atrophy and infertility. Recent studies suggesting an increased incidence of heart attack and stroke in patients taking testosterone was discussed. He was  informed there is conflicting evidence regarding the impact of testosterone therapy on cardiovascular risk. The theoretical risk of growth stimulation of an undetected prostate cancer was also discussed.  He was informed that current evidence does not provide any definitive answers regarding the risks of testosterone therapy on prostate cancer and cardiovascular disease. The need for periodic monitoring of his testosterone level, PSA, hematocrit and DRE was discussed.      No follow-ups on file.  These notes generated with voice recognition software. I apologize for typographical errors.  [place], [place] [address]  [place], [place] [address] [phone]

## 2022-05-26 ENCOUNTER — Ambulatory Visit: Payer: Self-pay | Admitting: Urology

## 2022-05-26 DIAGNOSIS — E349 Endocrine disorder, unspecified: Secondary | ICD-10-CM

## 2022-05-30 NOTE — Progress Notes (Signed)
05/31/2022 7:08 PM   Terry Lowe 1955/11/23 [id number]  Referring provider: No referring provider defined for this encounter.  Urological history: 1.  Erectile dysfunction -contributing factors of ED are HTN, BPH, CAD, HLD, age and former smoker.   2. BPH w/ LU TS -PSA (11/2021) 1.86  No chief complaint on file.   HPI: Terry Lowe is a 67 y.o. male who presents today for testosterone issue.   PMH: Past Medical History:  Diagnosis Date   BPH (benign prostatic hyperplasia)    CAD (coronary artery disease)    Erectile dysfunction    HTN (hypertension)    Hyperlipidemia    Hypogonadism in male    Low testosterone    with replacement   Squamous cell carcinoma    neck mass- surgery- radical neck dissection/radiation and chemo   Varicose vein    L lower leg- pain    Surgical History: Past Surgical History:  Procedure Laterality Date   Cath (other)  2007   hospital for chest pain   COLONOSCOPY WITH PROPOFOL N/A 11/22/2015   Procedure: COLONOSCOPY WITH PROPOFOL;  Surgeon: Manya Silvas, MD;  Location: [facility];  Service: Endoscopy;  Laterality: N/A;   CT of chest  9/10   atelectasis   CT of neck  9/10   enlarged lymph node   LN R neck  9/10   inconclusive fine needle bx   squamous cell carcinoma/metastatic     foundin neck 10/10 radical neck dissection   TSA  1960s    Home Medications:  Allergies as of 05/31/2022       Reactions   Pneumococcal Vaccine Polyvalent    REACTION: itching for over a year   Statins    REACTION: myalgia severe        Medication List        Accurate as of May 30, 2022  7:08 PM. If you have any questions, ask your nurse or doctor.          amLODipine 5 MG tablet Commonly known as: NORVASC Take 1 tablet (5 mg total) by mouth daily.   ascorbic acid 500 MG tablet Commonly known as: VITAMIN C Take 500 mg by mouth daily.   aspirin EC 81 MG tablet Take 81 mg by mouth daily.   b complex vitamins  capsule Take 1 capsule by mouth daily.   B-12 PO Take 500 mcg by mouth daily.   enalapril 20 MG tablet Commonly known as: VASOTEC Take 1 tablet (20 mg total) by mouth daily.   hydrochlorothiazide 25 MG tablet Commonly known as: HYDRODIURIL TAKE 1 TABLET BY MOUTH ONCE DAILY   metoprolol succinate 100 MG 24 hr tablet Commonly known as: TOPROL-XL Take 1 tablet (100 mg total) by mouth daily. Take with or immediately following a meal.   NEOMYCIN-POLYMYXIN-HC (OPHTH) Susp Apply 1 drop to eye 3 (three) times daily. To affected eye   Potassium 99 MG Tabs Take by mouth.   pyridOXINE 100 MG tablet Commonly known as: VITAMIN B6 Take 100 mg by mouth daily.   sildenafil 100 MG tablet Commonly known as: VIAGRA TAKE 1 TABLET BY MOUTH ONCE DAILY AS NEEDED FOR ERECTILE DYSFUNCTION   TURMERIC PO Take 1,000 mg by mouth daily.   VITAMIN D3 PO Take 1,000 Units by mouth daily.   vitamin E 180 MG (400 UNITS) capsule Take 400 Units by mouth daily.   zinc gluconate 50 MG tablet Take 50 mg by mouth daily.  Allergies:  Allergies  Allergen Reactions   Pneumococcal Vaccine Polyvalent     REACTION: itching for over a year   Statins     REACTION: myalgia severe    Family History: Family History  Problem Relation Age of Onset   Coronary artery disease Father    Melanoma Father    Heart disease Father        CAD   Cancer Father        melanoma   Breast cancer Mother     Social History:  reports that he has quit smoking. He has never used smokeless tobacco. He reports current alcohol use. He reports that he does not use drugs.  ROS: Pertinent ROS in HPI  Physical Exam: There were no vitals taken for this visit.  Constitutional:  Well nourished. Alert and oriented, No acute distress. HEENT: [place] AT, moist mucus membranes.  Trachea midline, no masses. Cardiovascular: No clubbing, cyanosis, or edema. Respiratory: Normal respiratory effort, no increased work of  breathing. GI: Abdomen is soft, non tender, non distended, no abdominal masses. Liver and spleen not palpable.  No hernias appreciated.  Stool sample for occult testing is not indicated.   GU: No CVA tenderness.  No bladder fullness or masses.  Patient with circumcised/uncircumcised phallus. ***Foreskin easily retracted***  Urethral meatus is patent.  No penile discharge. No penile lesions or rashes. Scrotum without lesions, cysts, rashes and/or edema.  Testicles are located scrotally bilaterally. No masses are appreciated in the testicles. Left and right epididymis are normal. Rectal: Patient with  normal sphincter tone. Anus and perineum without scarring or rashes. No rectal masses are appreciated. Prostate is approximately *** grams, *** nodules are appreciated. Seminal vesicles are normal. Skin: No rashes, bruises or suspicious lesions. Lymph: No cervical or inguinal adenopathy. Neurologic: Grossly intact, no focal deficits, moving all 4 extremities. Psychiatric: Normal mood and affect.  Laboratory Data: N/A  Pertinent Imaging: N/A  Assessment & Plan:  ***  1. Testosterone deficiency -Near castrate testosterone levels*** -Significant symptoms*** -Recommend starting TRT*** -We discussed the most common forms of replacement including intramuscular injection and gels and he desires to start injections*** -Rx testosterone cypionate-200 mg every 2 weeks to start*** -Appointment will be made for injection training*** -Follow-up 6 weeks after starting TRT for testosterone level and symptom check -Potential side effects of testosterone replacement were discussed including stimulation of benign prostatic growth with lower urinary tract symptoms; erythrocytosis; edema; gynecomastia; worsening sleep apnea; venous thromboembolism; testicular atrophy and infertility. Recent studies suggesting an increased incidence of heart attack and stroke in patients taking testosterone was discussed. He was  informed there is conflicting evidence regarding the impact of testosterone therapy on cardiovascular risk. The theoretical risk of growth stimulation of an undetected prostate cancer was also discussed.  He was informed that current evidence does not provide any definitive answers regarding the risks of testosterone therapy on prostate cancer and cardiovascular disease. The need for periodic monitoring of his testosterone level, PSA, hematocrit and DRE was discussed.      No follow-ups on file.  These notes generated with voice recognition software. I apologize for typographical errors.  [place], [place] [address]  [place], [place] [address] [phone]

## 2022-05-31 ENCOUNTER — Encounter: Payer: Self-pay | Admitting: Urology

## 2022-05-31 ENCOUNTER — Ambulatory Visit: Payer: No Typology Code available for payment source | Admitting: Urology

## 2022-05-31 VITALS — BP 160/85 | HR 94 | Ht 69.0 in | Wt 198.0 lb

## 2022-05-31 DIAGNOSIS — E349 Endocrine disorder, unspecified: Secondary | ICD-10-CM

## 2022-05-31 DIAGNOSIS — N401 Enlarged prostate with lower urinary tract symptoms: Secondary | ICD-10-CM | POA: Diagnosis not present

## 2022-05-31 DIAGNOSIS — N138 Other obstructive and reflux uropathy: Secondary | ICD-10-CM

## 2022-05-31 DIAGNOSIS — N529 Male erectile dysfunction, unspecified: Secondary | ICD-10-CM

## 2022-05-31 DIAGNOSIS — E291 Testicular hypofunction: Secondary | ICD-10-CM

## 2022-05-31 DIAGNOSIS — F524 Premature ejaculation: Secondary | ICD-10-CM | POA: Diagnosis not present

## 2022-05-31 MED ORDER — ALFUZOSIN HCL ER 10 MG PO TB24: 10.0000 mg | ORAL_TABLET | Freq: Every day | ORAL | 3 refills | Status: DC

## 2022-05-31 MED ORDER — TADALAFIL 5 MG PO TABS: 5.0000 mg | ORAL_TABLET | Freq: Every day | ORAL | 3 refills | Status: DC | PRN

## 2022-05-31 NOTE — Patient Instructions (Addendum)
[email or web address]

## 2022-06-01 ENCOUNTER — Telehealth: Payer: Self-pay | Admitting: Family Medicine

## 2022-06-01 LAB — TESTOSTERONE: Testosterone: 268 ng/dL (ref 264–916)

## 2022-06-01 NOTE — Telephone Encounter (Signed)
Patient notified and voiced understanding. Lab appointment scheduled.

## 2022-06-01 NOTE — Telephone Encounter (Signed)
-----   Message from Nori Riis, PA-C sent at 06/01/2022  7:25 AM EST ----- Please let Terry Lowe know that his testosterone level was actually in the low normal range.  Since this was an afternoon draw, we need to recheck his testosterone before 10 am for conformation.

## 2022-06-07 ENCOUNTER — Other Ambulatory Visit: Payer: No Typology Code available for payment source

## 2022-06-07 ENCOUNTER — Other Ambulatory Visit: Payer: Self-pay | Admitting: Family Medicine

## 2022-06-07 DIAGNOSIS — E349 Endocrine disorder, unspecified: Secondary | ICD-10-CM

## 2022-06-08 LAB — TESTOSTERONE: Testosterone: 280 ng/dL (ref 264–916)

## 2022-06-09 ENCOUNTER — Telehealth: Payer: Self-pay | Admitting: Family Medicine

## 2022-06-09 DIAGNOSIS — E291 Testicular hypofunction: Secondary | ICD-10-CM

## 2022-06-09 NOTE — Telephone Encounter (Signed)
Patient notified and he states he will do injection and use the Good Rx coupon. He states heuse to do injection a long time ago and he would do them 1 time a month.

## 2022-06-09 NOTE — Telephone Encounter (Signed)
Testosterone total and free order added to last lab.

## 2022-06-09 NOTE — Telephone Encounter (Signed)
-----   Message from Nori Riis, PA-C sent at 06/08/2022 12:12 PM EDT ----- Please let [name] know that his testosterone level is low normal, so his insurance will likely not cover any testosterone medication.   We could check a free and total testosterone and see if that is low.  ----- Message ----- From: [name], MD Sent: 06/08/2022   8:11 AM EDT To: Nori Riis, PA-C   ----- Message ----- From: Lavone Neri Lab Results In Sent: 06/08/2022   5:37 AM EDT To: [name], MD

## 2022-06-11 LAB — SPECIMEN STATUS REPORT

## 2022-06-11 LAB — TESTOSTERONE, FREE: Testosterone, Free: 8.3 pg/mL (ref 6.6–18.1)

## 2022-06-12 ENCOUNTER — Telehealth: Payer: Self-pay | Admitting: Family Medicine

## 2022-06-12 ENCOUNTER — Other Ambulatory Visit: Payer: Self-pay | Admitting: Urology

## 2022-06-12 DIAGNOSIS — E291 Testicular hypofunction: Secondary | ICD-10-CM

## 2022-06-12 MED ORDER — TESTOSTERONE CYPIONATE 200 MG/ML IM SOLN: 200.0000 mg | INTRAMUSCULAR | 0 refills | Status: AC

## 2022-06-12 NOTE — Telephone Encounter (Signed)
-----   Message from Nori Riis, PA-C sent at 06/12/2022  8:15 AM EDT ----- Please let Mr. Orndoff know that his free testosterone was normal as well, so he will very likely have to pay out of pocket for his TRT.  We can try to go through his insurance first.  What pharmacy would he like for [place] to send the medication?

## 2022-06-12 NOTE — Telephone Encounter (Signed)
LMOM informed patient medication was sent to pharmacy. When he has the medication he is to call and schedule a  appointment for injection.

## 2022-06-12 NOTE — Telephone Encounter (Signed)
Patient notified and will get the injections. He wants the medication sent to [facility].

## 2023-05-02 NOTE — Progress Notes (Signed)
 COLONOSCOPY OPEN ACCESS HISTORY AND PHYSICAL  No diagnosis found. Referring provider: Rudolpho  Primary Care Physician:  Rudolpho Norleen Lenis, MD DOS: 12/01/2022  Has the patient had any prior colon evaluation:   [x]   Yes      -Colonoscopy 11/22/2015 with Dr. Viktoria at [facility], Adenomatous Polyps  Does the patient have a family history of colon cancer:   [x]   No                         Does the patient have a family history of colon polyps:    [x]   Yes       Family Member:  [x]   Mother   VITAL SIGNS 12/01/2022 CPE:     BP 140/88 Important  Pulse 68 Ht 182.9 cm (6') Wt 89.8 kg (198 lb) SpO2 99% BMI 26.85 kg/m BSA 2.14 m Pain [place] 0-No pain                       ALLERGIES: Allergies  Allergen Reactions  . Pneumococcal 23-Val Ps Vaccine Itching    Itching x 1 year  . Statins-Hmg-Coa Reductase Inhibitors Muscle Pain    REACTION: myalgia severe    CURRENT MEDICATIONS: Current Outpatient Medications  Medication Sig Dispense Refill  . terbinafine HCL (LAMISIL) 250 mg tablet Take 1 tablet (250 mg total) by mouth once daily 30 tablet 3  . amLODIPine  (NORVASC ) 5 MG tablet Take 1 tablet (5 mg total) by mouth once daily 90 tablet 3  . ascorbic acid, vitamin C, (VITAMIN C) 500 MG tablet Take by mouth.    SABRA aspirin 81 MG EC tablet Take by mouth.    . celecoxib (CELEBREX) 100 MG capsule Take 1 capsule (100 mg total) by mouth 2 (two) times daily 60 capsule 3  . cholecalciferol (VITAMIN D3) 1000 unit capsule Take by mouth    . cyanocobalamin (VITAMIN B12) 500 MCG tablet Take by mouth    . enalapril  (VASOTEC ) 10 MG tablet Take 1 tablet (10 mg total) by mouth once daily 90 tablet 3  . hydroCHLOROthiazide  (HYDRODIURIL ) 25 MG tablet Take 1 tablet (25 mg total) by mouth once daily 90 tablet 3  . metoprolol  succinate (TOPROL -XL) 100 MG XL tablet Take 1 tablet (100 mg total) by mouth once daily 90 tablet 3  . sildenafiL  (VIAGRA ) 100 MG tablet Take 1 tablet (100 mg total) by mouth once daily as  needed for Erectile Dysfunction 10 tablet 3  . turmeric, bulk, 100 % Powd Take by mouth     No current facility-administered medications for this visit.     HISTORY: Anticoagulation Therapy:       [x]   No  Stroke:             [x]   No    Prosthetic Heart Valves:         [x]   No  Previous Heart Attack:          [x]   No  Congestive Heart Failure:      [x]   No  Bleeding Disorder:             [x]   No  Lung Disease, asthma, COPD:  [x]   No  Renal Insufficiency:            [x]   No       Dialysis:         [x]   No   Glomerular Filtration Rate (eGFR)  Date Value Ref  Range Status  11/29/2022 95 >60 mL/min/1.73sq m Final    Comment:    CKD-EPI (2021) does not include patient's race in the calculation of eGFR.  Monitoring changes of plasma creatinine and eGFR over time is useful for monitoring kidney function.   Interpretive Ranges for eGFR (CKD-EPI 2021):  eGFR:       >60 mL/min/1.73 sq. m - Normal eGFR:       30-59 mL/min/1.73 sq. m - Moderately Decreased eGFR:       15-29 mL/min/1.73 sq. m  - Severely Decreased eGFR:       < 15 mL/min/1.73 sq. m  - Kidney Failure    Note: These eGFR calculations do not apply in acute situations when eGFR is changing rapidly or patients on dialysis.    Diabetic:            [x]   No  Cirrhosis/Liver Disease:       [x]   No  Initial consult on 01/24/2023  Component Date Value Ref Range Status  . Antinuclear Antibodies, IFA - [facility] 01/24/2023 Negative   Final                                        Negative   <1:80                                      Borderline  1:80                                      Positive   >1:80 ICAP nomenclature: AC-0 For more information about Hep-2 cell patterns use ANApatterns.org, the official website for the International Consensus on Antinuclear Antibody (ANA) Patterns (ICAP).  . CCP Antibodies IgG/IgA - [facility] 01/24/2023 6  0 - 19 units Final                             Negative               <20                            Weak positive      20 - 39                           Moderate positive  40 - 59                           Strong positive        >59  . 14.3.3 ETA, Rheum.Arthritis - [facility] 01/24/2023 <0.20  <0.20 ng/mL Final   Negative: <0.20                Moderate Positive: 0.40-0.79 Weak Positive: 0.20-0.39         Strong Positive: >0.79  . Anti-CEP-1 Ab, IgG (RDL) - [facility] 01/24/2023 <79  <20 Units Final       Negative: <20            Weak Positive: 20-39     Moderate Positive: 40-80 Strong Positive: >80  . Anti-Sa Ab, IgG (  RDL) - [facility] 01/24/2023 <79  <20 Units Final       Negative: <20            Weak Positive: 20-39     Moderate Positive: 40-80 Strong Positive: >80  . Anti-CarP Ab - [facility] 01/24/2023 <20  <20 Units Final   Comment:   Interpretation for CarP:                                   Negative:      <20                                   Positive:      >19   14-3-3 eta protein is a joint-derived pro-   inflammatory mediator that is implicated in the   joint erosion process and pathogenesis of RA. Serum   14-3-3 eta protein is elevated in both established   RA (sensitivity 77%) and early RA (sensitivity   59-64%).[1,2]   Serum 14-3-3 eta may be especially helpful in   identifying patients with early RA where it may   provide a 15% incremental benefit to the diagnostic   sensitivity of RF and Anti-CCP.[1,3]   Anti-CEP1 (Citrullinated alpha-enolase peptide 1)   antibodies may identify a subset of patients with a   higher frequency of joint erosions.[4]   Anti-Sa (Citrullinated Vimentin) positive patients   with early RA may have more severe disease.[5]   Anti-CarP (Carbamylated Protein) antibodies may be   positive in about 10% of early RA patients who are   RF-negative and                           Anti-CCP-negative and may be present   years before the onset of RA symptoms.[6,7]   Anti-CarP is associated with more active disease and   higher risk of developing joint  erosions.[6]   1. Maksymowych WP et al.J Rheumatol [id number]):      [id number].   2. Maksymowych WP et al.J Rheumatol 2014;41(11):1-10.   3. Carrier N, et al. Arthritis Res Ther 2016;18:37.   4. Montes A et al.Arthritis Rheum 2011;63(3):654-661.   5. El-Gabalawy HS et al.Arthritis Res Ther [id number])   6. Truchetet ME, et al. Arthritis Rheum [id number]):      [id number].   7. Tiffany HERO, et al. Arthritis Res Ther [phone].  . Sjogren's Anti-SS-A - [facility] 01/24/2023 <0.2  0.0 - 0.9 AI Final  . Sjogren's Anti-SS-B - [facility] 01/24/2023 0.8  0.0 - 0.9 AI Final  . Sedimentation Rate-Automated 01/24/2023 4  0 - 20 mm/hr Final  . C Reactive Protein - [facility] 01/24/2023 1  0 - 10 mg/L Final  Appointment on 11/29/2022  Component Date Value Ref Range Status  . Glucose 11/29/2022 120 (H)  70 - 110 mg/dL Final  . Sodium 90/95/7975 140  136 - 145 mmol/L Final  . Potassium 11/29/2022 4.5  3.6 - 5.1 mmol/L Final  . Chloride 11/29/2022 103  97 - 109 mmol/L Final  . Carbon Dioxide (CO2) 11/29/2022 32.0  22.0 - 32.0 mmol/L Final  . Urea Nitrogen (BUN) 11/29/2022 20  7 - 25 mg/dL Final  . Creatinine 90/95/7975 0.9  0.7 - 1.3 mg/dL Final  . Glomerular Filtration Rate (eGFR) 11/29/2022 95  >60 mL/min/1.73sq m Final   CKD-EPI (  2021) does not include patient's race in the calculation of eGFR.  Monitoring changes of plasma creatinine and eGFR over time is useful for monitoring kidney function.   Interpretive Ranges for eGFR (CKD-EPI 2021):  eGFR:       >60 mL/min/1.73 sq. m - Normal eGFR:       30-59 mL/min/1.73 sq. m - Moderately Decreased eGFR:       15-29 mL/min/1.73 sq. m  - Severely Decreased eGFR:       < 15 mL/min/1.73 sq. m  - Kidney Failure    Note: These eGFR calculations do not apply in acute situations when eGFR is changing rapidly or patients on dialysis.  . Calcium 11/29/2022 9.3  8.7 - 10.3 mg/dL Final  . AST  90/95/7975 17  8 - 39 U/L Final  . ALT  11/29/2022 22  6 - 57 U/L Final  . Alk  Phos (alkaline Phosphatase) 11/29/2022 68  34 - 104 U/L Final  . Albumin 11/29/2022 4.4  3.5 - 4.8 g/dL Final  . Bilirubin, Total 11/29/2022 0.7  0.3 - 1.2 mg/dL Final  . Protein, Total 11/29/2022 6.2  6.1 - 7.9 g/dL Final  . A/G Ratio 90/95/7975 2.4  1.0 - 5.0 gm/dL Final  . WBC (White Blood Cell Count) 11/29/2022 5.0  4.1 - 10.2 10^3/uL Final  . RBC (Red Blood Cell Count) 11/29/2022 4.95  4.69 - 6.13 10^6/uL Final  . Hemoglobin 11/29/2022 15.4  14.1 - 18.1 gm/dL Final  . Hematocrit 90/95/7975 45.0  40.0 - 52.0 % Final  . MCV (Mean Corpuscular Volume) 11/29/2022 90.9  80.0 - 100.0 fl Final  . MCH (Mean Corpuscular Hemoglobin) 11/29/2022 31.1  27.0 - 31.2 pg Final  . MCHC (Mean Corpuscular Hemoglobin * 11/29/2022 34.2  32.0 - 36.0 gm/dL Final  . Platelet Count 11/29/2022 146 (L)  150 - 450 10^3/uL Final  . RDW-CV (Red Cell Distribution Widt* 11/29/2022 12.1  11.6 - 14.8 % Final  . MPV (Mean Platelet Volume) 11/29/2022 9.4  9.4 - 12.4 fl Final  . Neutrophils 11/29/2022 3.26  1.50 - 7.80 10^3/uL Final  . Lymphocytes 11/29/2022 1.01  1.00 - 3.60 10^3/uL Final  . Monocytes 11/29/2022 0.48  0.00 - 1.50 10^3/uL Final  . Eosinophils 11/29/2022 0.16  0.00 - 0.55 10^3/uL Final  . Basophils 11/29/2022 0.04  0.00 - 0.09 10^3/uL Final  . Neutrophil % 11/29/2022 65.6  32.0 - 70.0 % Final  . Lymphocyte % 11/29/2022 20.3  10.0 - 50.0 % Final  . Monocyte % 11/29/2022 9.7  4.0 - 13.0 % Final  . Eosinophil % 11/29/2022 3.2  1.0 - 5.0 % Final  . Basophil% 11/29/2022 0.8  0.0 - 2.0 % Final  . Immature Granulocyte % 11/29/2022 0.4  <=0.7 % Final  . Immature Granulocyte Count 11/29/2022 0.02  <=0.06 10^3/L Final  . Cholesterol, Total 11/29/2022 202 (H)  100 - 200 mg/dL Final  . Triglyceride 90/95/7975 183  35 - 199 mg/dL Final  . HDL (High Density Lipoprotein) Cho* 11/29/2022 40.3  29.0 - 71.0 mg/dL Final  . LDL Calculated 11/29/2022 874  0 - 130 mg/dL Final  . VLDL Cholesterol 11/29/2022 37  mg/dL Final   . Cholesterol/HDL Ratio 11/29/2022 5.0   Final  . PSA (Prostate Specific Antigen), T* 11/29/2022 2.28  0.10 - 4.00 ng/mL Final  . Color 11/29/2022 Light Yellow  Colorless, Straw, Light Yellow, Yellow, Dark Yellow Final   This is an appended report.  These results have been appended to  a previously final verified report.  . Clarity 11/29/2022 Clear  Clear Final   This is an appended report.  These results have been appended to a previously final verified report.  SABRA Specific Gravity 11/29/2022 1.024  1.005 - 1.030 Final   This is an appended report.  These results have been appended to a previously final verified report.  . pH, Urine 11/29/2022 5.5  5.0 - 8.0 Final   This is an appended report.  These results have been appended to a previously final verified report.  . Protein, Urinalysis 11/29/2022 Negative  Negative mg/dL Final   This is an appended report.  These results have been appended to a previously final verified report.  . Glucose, Urinalysis 11/29/2022 Negative  Negative mg/dL Final   This is an appended report.  These results have been appended to a previously final verified report.  SABRA Ketones, Urinalysis 11/29/2022 Negative  Negative mg/dL Final   This is an appended report.  These results have been appended to a previously final verified report.  . Blood, Urinalysis 11/29/2022 Negative  Negative Final   This is an appended report.  These results have been appended to a previously final verified report.  . Nitrite, Urinalysis 11/29/2022 Negative  Negative Final   This is an appended report.  These results have been appended to a previously final verified report.  . Leukocyte Esterase, Urinalysis 11/29/2022 Negative  Negative Final   This is an appended report.  These results have been appended to a previously final verified report.  . Bilirubin, Urinalysis 11/29/2022 Negative  Negative Final   This is an appended report.  These results have been appended to a previously final  verified report.  . Urobilinogen, Urinalysis 11/29/2022 0.2  0.2 - 1.0 mg/dL Final   This is an appended report.  These results have been appended to a previously final verified report.  . WBC, UA 11/29/2022 1  <=5 /hpf Final  . Red Blood Cells, Urinalysis 11/29/2022 1  <=3 /hpf Final  . Bacteria, Urinalysis 11/29/2022 0-5  0 - 5 /hpf Final  . Squamous Epithelial Cells, Urinaly* 11/29/2022 0  /hpf Final   Artificial Joints:           [x]   No  Sleep Apnea:                [x]   No  Allergic To Eggs:      [x]   No   In the last 6 months has the patient had any of the following: - Fevers:   [x]   No  - Chest Pain, Pressure, Heaviness, Tightness: [x]   No  - Skipped Heart Beats or Palpitations:  [x]   No  - Shortness of Breath (at rest or on exertion):   [x]   No  - Fainting or Passing Out:   [x]   No  - Recurrent &/or Frequent Coughing/Wheezing:   [x]   No  - Surgery:   [x]   No       New Medical Diagnosis:   [x]   No  Last ER visit: 2013 for CP How often do you have a BM? Daily  Will the patient need to stop any medications prior to procedure:  [x]   Yes      Patient will stop vitamins and supplements as per protocol.  Does the patient require antibiotics prior to dental procedures:   [x]   No  Have you ever had any cervical spine injuries or surgeries:   [x]   No  Patient's allergies, medications, medical, surgical, family and social history were updated during today's visit with information gathered from the patient as well as from [facility]. The Colonoscopy including bowel prep, procedure information and potential complications were discussed with the patient. Patient agreed to proceed with scheduling as noted. Patient was encouraged to check potential out of pocket costs for this procedure including the procedure itself, anesthesia and polyp removal. The Insurance Benefit Questions sheet was provided in addition to the colonoscopy instructions. Patient was instructed to contact  our office prior to the procedure should they have any questions.  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 He was encouraged to check cost based on hx of polyps. BMI:  27  DIAGNOSIS: [x]   Diagnostic colonoscopy due to personal history of polyps  Colon Scheduled: Wednesday, 07/04/2023 at [facility] with Dr. Onita Prep: Reynaldo ELENOR LITTIE KENNYTH, RN    Date: 05/02/2023

## 2023-06-05 ENCOUNTER — Other Ambulatory Visit: Payer: Self-pay | Admitting: Urology

## 2023-06-05 DIAGNOSIS — N138 Other obstructive and reflux uropathy: Secondary | ICD-10-CM

## 2023-06-18 NOTE — Progress Notes (Signed)
 No chief complaint on file.   HPI  Terry Lowe is a 68 y.o. here for an acute issue.  He has a PMH of HTN, BPH, tobacco use who has a couple of concerns.  Describes a few months now of getting full quicker than usual.  Mild discomfort but no severe pain.  Appetite has been down but no weight loss.  No referred pain to the back.  Minimal belching.  Has taken Nexium without much benefit.  Denies bowel or bladder changes.  No fevers and chills.  No nausea vomiting.  Also still has onychomycosis and is interested in topical treatment.    ROS  Pertinent items are noted in HPI.  Outpatient Encounter Medications as of 06/18/2023  Medication Sig Dispense Refill  . amLODIPine  (NORVASC ) 5 MG tablet Take 1 tablet (5 mg total) by mouth once daily 90 tablet 3  . ascorbic acid, vitamin C, (VITAMIN C) 500 MG tablet Take by mouth.    SABRA aspirin 81 MG EC tablet Take by mouth.    . celecoxib (CELEBREX) 100 MG capsule Take 1 capsule (100 mg total) by mouth 2 (two) times daily 60 capsule 3  . cholecalciferol (VITAMIN D3) 1000 unit capsule Take by mouth    . cyanocobalamin (VITAMIN B12) 500 MCG tablet Take by mouth    . enalapril  (VASOTEC ) 10 MG tablet Take 1 tablet (10 mg total) by mouth once daily 90 tablet 3  . esomeprazole (NEXIUM) 20 MG DR capsule Take 20 mg by mouth once daily    . hydroCHLOROthiazide  (HYDRODIURIL ) 25 MG tablet Take 1 tablet (25 mg total) by mouth once daily 90 tablet 3  . metoprolol  succinate (TOPROL -XL) 100 MG XL tablet Take 1 tablet (100 mg total) by mouth once daily 90 tablet 3  . sildenafiL  (VIAGRA ) 100 MG tablet Take 1 tablet (100 mg total) by mouth once daily as needed for Erectile Dysfunction 10 tablet 3  . sodium, potassium, and magnesium (SUPREP) oral solution Take 1 Bottle by mouth as directed One kit contains 2 bottles.  Take both bottles at the times instructed by your provider. 354 mL 0  . terbinafine HCL (LAMISIL) 250 mg tablet Take 1 tablet (250 mg total) by mouth once  daily 30 tablet 3  . turmeric, bulk, 100 % Powd Take by mouth     No facility-administered encounter medications on file as of 06/18/2023.    Allergies as of 06/18/2023 - Reviewed 05/02/2023  Allergen Reaction Noted  . Pneumococcal 23-val ps vaccine Itching 05/02/2023  . Statins-hmg-coa reductase inhibitors Muscle Pain 09/30/2009    No past medical history on file.  Past Surgical History:  Procedure Laterality Date  . COLONOSCOPY N/A 11/22/2015   Dr. FABIENE Holmes @ [facility] - Adenomatous Polyps, FHPolyps(m), 3 yr rpt per RTE  . EGD  2000 ?   [place]    Vitals:   06/18/23 0859  BP: 130/80  Pulse: 83    Physical Exam  General. Well appearing; NAD; VS reviewed     HEENT: Sclera and conjunctiva clear; EOMI,  Lungs. Respirations unlabored; clear to auscultation bilaterally. Cardiovascular. Heart regular rate and rhythm without murmurs, gallops, or rubs. Abdomen:  Soft, non tender.  Normoactive bowel sounds.   Extremities:  No edema. Skin. Normal color and turgor Neurologic. Alert and oriented x3   Assessment and Plan 1. Dyspepsia More noticeable the last few months.  Anticipating colonoscopy next month.  Will contact GI to determine if endoscopy is indicated.  Check labs today.  Start Protonix. -     CBC w/auto Differential (5 Part) -     Comprehensive Metabolic Panel (CMP) -     Lipase -     Ambulatory Referral to Gastroenterology -     pantoprazole (PROTONIX) 40 MG DR tablet; Take 1 tablet (40 mg total) by mouth once daily  2. Onychomycosis  -     ciclopirox (PENLAC) 8 % topical nail solution; Apply topically at bedtime for 90 days Apply over nail and surrounding skin.    I have personally performed this service.  [address] [place], [place]

## 2023-06-25 NOTE — Progress Notes (Signed)
 PATIENT PROFILE: Terry Lowe is a 68 y.o. male who presents to the [facility] for consultation at the request of Myer Manus, PA-C, for chief complaint of GERD/dyspepsia.   HISTORY OF PRESENT ILLNESS   Terry Lowe presents to the [facility] at the request of Myer Manus, PA-C, for chief complaint of GERD, dyspepsia, and early satiety. He was seen by open access nurse in February for colonoscopy scheduling and is currently scheduled for surveillance colonoscopy with Dr. Onita on 4/9 next week. However, he reports over the past 40-months he has been experiencing significant dyspepsia symptoms. He endorses issues with early satiety, belching, postprandial bloating, and reflux. He was seen by Terry Lowe last week in internal medicine and was started on Protonix 40 mg daily and referred to GI to discuss EGD. He reports previous EGD in 2000 by Dr. Chipper with unknown results. He reports hx of head and neck cancer s/p chemoradiation in 2010. He denies any complaints of esophageal dysphagia, odynophagia, hoarseness, nausea, or vomiting. He has noticed decreased appetite with no unintentional weight loss. He does drink 3-4 cups of coffee daily. He can drink 4-5 beers during the week. He denies any tobacco use. No major changes in bowel habits. He denies any overt hematochezia or melena.    GENERAL REVIEW OF SYSTEMS: General: negative for - fever, chills, weight gain, weight loss, fatigue Head: no injury, migraines, headaches Eyes: no jaundice, itching, dryness, tearing, redness, vision changes Nose: no injury, bleeding Mouth/Throat: no oral ulcers, swollen neck, dry mouth, sore throat, hoarseness Endocrine: no heat/cold intolerance Respiratory: no cough, wheezing, SOB Cardiovascular: no chest pain, palpitations GI: see HPI Musculoskeletal: no joint swelling, muscle/joint pain  Neurological: no seizures, syncope, dizziness, numbness/tingling Skin: no rashes, itching Hematological and  Lymphatic: easy bruising, easy bleeding  MEDICATIONS: Outpatient Encounter Medications as of 06/25/2023  Medication Sig Dispense Refill  . alfuzosin  (UROXATRAL ) 10 mg ER tablet Take 1 tablet by mouth daily with breakfast    . amLODIPine  (NORVASC ) 5 MG tablet Take 1 tablet (5 mg total) by mouth once daily 90 tablet 3  . ascorbic acid, vitamin C, (VITAMIN C) 500 MG tablet Take by mouth.    SABRA aspirin 81 MG EC tablet Take by mouth.    . celecoxib (CELEBREX) 100 MG capsule Take 1 capsule (100 mg total) by mouth 2 (two) times daily 60 capsule 3  . cholecalciferol (VITAMIN D3) 1000 unit capsule Take by mouth    . ciclopirox (PENLAC) 8 % topical nail solution Apply topically at bedtime for 90 days Apply over nail and surrounding skin. 6 mL 4  . cyanocobalamin (VITAMIN B12) 500 MCG tablet Take by mouth    . enalapril  (VASOTEC ) 10 MG tablet Take 1 tablet (10 mg total) by mouth once daily 90 tablet 3  . hydroCHLOROthiazide  (HYDRODIURIL ) 25 MG tablet Take 1 tablet (25 mg total) by mouth once daily 90 tablet 3  . metoprolol  succinate (TOPROL -XL) 100 MG XL tablet Take 1 tablet (100 mg total) by mouth once daily 90 tablet 3  . pantoprazole (PROTONIX) 40 MG DR tablet Take 1 tablet (40 mg total) by mouth once daily 30 tablet 11  . sildenafiL  (VIAGRA ) 100 MG tablet Take 1 tablet (100 mg total) by mouth once daily as needed for Erectile Dysfunction 10 tablet 3  . sodium, potassium, and magnesium (SUPREP) oral solution Take 1 Bottle by mouth as directed One kit contains 2 bottles.  Take both bottles at the times instructed  by your provider. 354 mL 0  . terbinafine HCL (LAMISIL) 250 mg tablet Take 1 tablet (250 mg total) by mouth once daily 30 tablet 3  . turmeric, bulk, 100 % Powd Take by mouth     No facility-administered encounter medications on file as of 06/25/2023.    ALLERGIES: Pneumococcal 23-val ps vaccine and Statins-hmg-coa reductase inhibitors  PAST MEDICAL HISTORY: No past medical history on  file.  PAST SURGICAL HISTORY: Past Surgical History:  Procedure Laterality Date  . COLONOSCOPY N/A 11/22/2015   Dr. FABIENE Holmes @ [facility] - Adenomatous Polyps, FHPolyps(m), 3 yr rpt per RTE  . EGD  2000 ?   [place]     FAMILY HISTORY: Family History  Problem Relation Name Age of Onset  . Colon polyps Mother       SOCIAL HISTORY: Social History   Socioeconomic History  . Marital status: Married  Tobacco Use  . Smoking status: Some Days    Types: Cigarettes    Passive exposure: Current  . Smokeless tobacco: Never  Vaping Use  . Vaping status: Never Used  Substance and Sexual Activity  . Alcohol use: Yes    Comment: social  . Drug use: Not Currently  . Sexual activity: Defer   Social Drivers of Health   Financial Resource Strain: Low Risk  (06/25/2023)   Overall Financial Resource Strain (CARDIA)   . Difficulty of Paying Living Expenses: Not very hard  Food Insecurity: No Food Insecurity (06/25/2023)   Hunger Vital Sign   . Worried About Programme researcher, broadcasting/film/video in the Last Year: Never true   . Ran Out of Food in the Last Year: Never true  Transportation Needs: No Transportation Needs (06/25/2023)   PRAPARE - Transportation   . Lack of Transportation (Medical): No   . Lack of Transportation (Non-Medical): No    Vitals:   06/25/23 0912  BP: (!) 151/91  Pulse: 80  Weight: 87.7 kg (193 lb 6.4 oz)  Height: 182.9 cm (6')    Wt Readings from Last 3 Encounters:  06/25/23 87.7 kg (193 lb 6.4 oz)  06/18/23 87.5 kg (193 lb)  01/24/23 89.8 kg (198 lb)     PHYSICAL EXAM: Pleasant, non-toxic appearing male on exam bench. Answers all questions appropriately and thoroughly.  General: NAD, alert and oriented x 4 HEENT: PEERLA, EOMI, anciteric  Neck: supple, no JVD or thyromegaly. No lymphadenopathy.  Respiratory: CTA bilaterally, no wheezes, crackles, or other adventitious sounds Cardiac: RRR, no murmur, rub, or gallop  GI: soft, normal bowel sounds, no TTP, no HSM, no rebound  or guarding Msk: no edema, well perfused with 2+ pulses, Skin: Skin color, texture, turgor normal, no rashes or lesions Lymph: no LAD Neuro: Grossly intact   REVIEW OF DATA: I have reviewed the following data today:  Previous OV notes with PCP reviewed - see HPI  Summary of GI Procedures:  CSY 11/22/2015 Cathe) - diverticulosis in sigmoid, descending, and transverse colon, one diminutive TA removed from ascending colon, two diminutive polyps removed from transverse colon with path showing TA x1 and hyperplastic polyp x1, one diminutive TA removed from cecum  ASSESSMENT AND PLAN: Terry Lowe is a 68 y.o. male presenting for an initial consultation.   Diagnoses and all orders for this visit:  Gastroesophageal reflux disease, unspecified whether esophagitis present -     Ambulatory Referral to Colonoscopy and Upper Endoscopy  Dyspepsia -     Ambulatory Referral to Colonoscopy and Upper Endoscopy  Early satiety -  Ambulatory Referral to Colonoscopy and Upper Endoscopy  Hx of adenomatous colonic polyps -     Ambulatory Referral to Colonoscopy and Upper Endoscopy  Hx of malignant neoplasm of head and neck    68 y/o Caucasian male with a PMH of HTN, HLD, BPH, ED, prediabetes, and hx of head and neck cancer s/p chemoradiation presents to the [facility] for chief complaint of 7-month history of dyspepsia/GERD symptoms  GERD/Dyspepsia  Early satiety  Hx of adenomatous colon polyps  - Reviewed GERD pathophysiology, diet, and lifestyle modifications. Handout provided to patient in AVS.  - Continue Protonix 40 mg PO daily - Recommend he implement antireflux precautions. Discussed today.  - Advise reducing caffeine/EtOH intake as likely contributing to increased symptoms - Recommend EGD for endoluminal evaluation to rule out peptic ulcer disease, gastritis, esophagitis, Barrett's esophagus, neoplasm, etc - EGD and colonoscopy scheduled 4/9 with Dr. Onita at [facility] with  Suprep bowel prep. Copy of instructions provided to patient.  - Further recs after procedures  I reviewed the risks (including bleeding, perforation, infection, anesthesia complications, cardiac/respiratory complications), benefits and alternatives of EGD and colonoscopy. Patient consents to proceed. Denies CP/SOB/blood thinners.    Attestation Statement:   I personally performed the service, non-incident to. [facility])   TWYLA JONETTE PRIMMER, PA     JONETTE PRIMMER, PA-C [facility], Gastroenterology [address] [place], [place] [address] [phone]

## 2023-07-04 ENCOUNTER — Ambulatory Visit (INDEPENDENT_AMBULATORY_CARE_PROVIDER_SITE_OTHER): Payer: Self-pay

## 2023-07-04 ENCOUNTER — Ambulatory Visit: Admit: 2023-07-04 | Admitting: Gastroenterology

## 2023-07-04 DIAGNOSIS — D122 Benign neoplasm of ascending colon: Secondary | ICD-10-CM | POA: Diagnosis not present

## 2023-07-04 DIAGNOSIS — K635 Polyp of colon: Secondary | ICD-10-CM | POA: Diagnosis not present

## 2023-07-04 DIAGNOSIS — D123 Benign neoplasm of transverse colon: Secondary | ICD-10-CM | POA: Diagnosis not present

## 2023-07-04 DIAGNOSIS — Z860101 Personal history of adenomatous and serrated colon polyps: Secondary | ICD-10-CM | POA: Diagnosis not present

## 2023-07-04 DIAGNOSIS — K2270 Barrett's esophagus without dysplasia: Secondary | ICD-10-CM | POA: Diagnosis not present

## 2023-07-04 DIAGNOSIS — D124 Benign neoplasm of descending colon: Secondary | ICD-10-CM | POA: Diagnosis not present

## 2023-07-04 DIAGNOSIS — Z09 Encounter for follow-up examination after completed treatment for conditions other than malignant neoplasm: Secondary | ICD-10-CM | POA: Diagnosis present

## 2023-07-04 SURGERY — COLONOSCOPY
Anesthesia: General

## 2023-08-07 ENCOUNTER — Other Ambulatory Visit: Payer: Self-pay | Admitting: Urology

## 2023-08-07 DIAGNOSIS — N138 Other obstructive and reflux uropathy: Secondary | ICD-10-CM

## 2023-08-29 IMAGING — CR DG ABDOMEN 1V
2 series · 2 of 2 positions shown · non-contrast
Comparison: None.

CLINICAL DATA: Hematuria

EXAM:
ABDOMEN - 1 VIEW

[abdomen kub (1 of 2)]
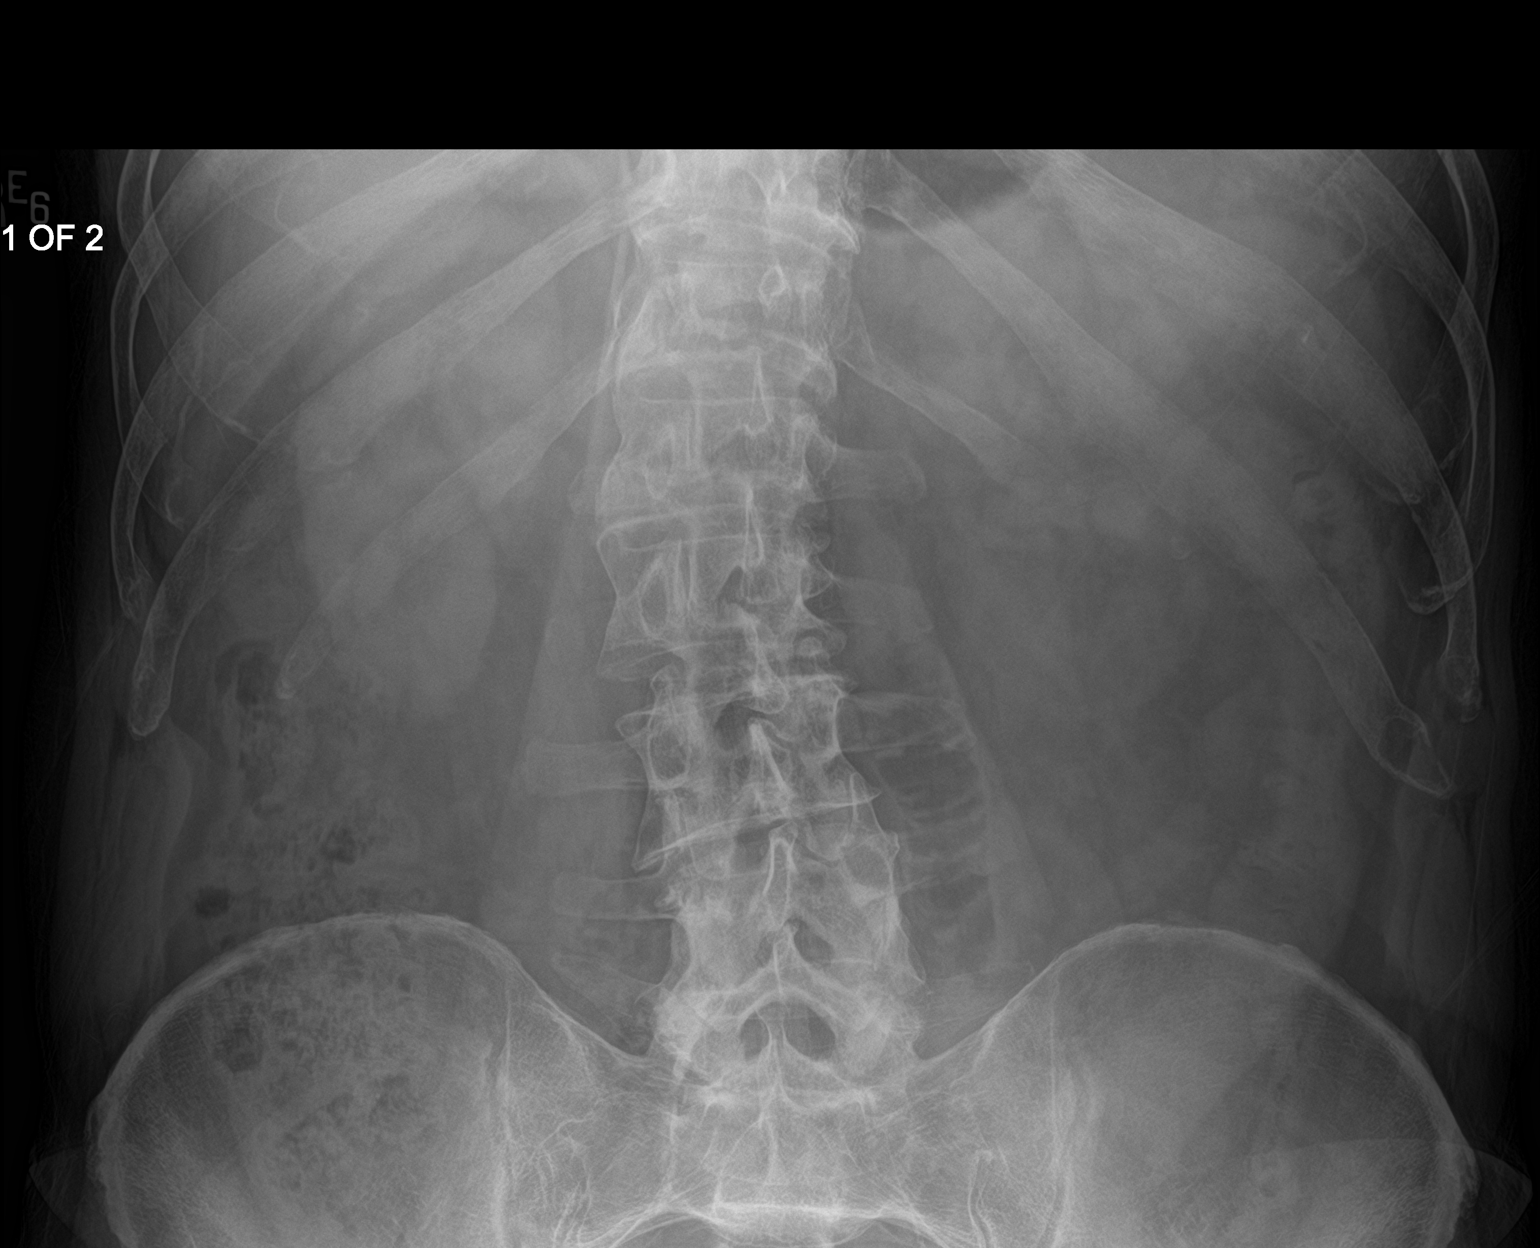

[abdomen kub (2 of 2)]
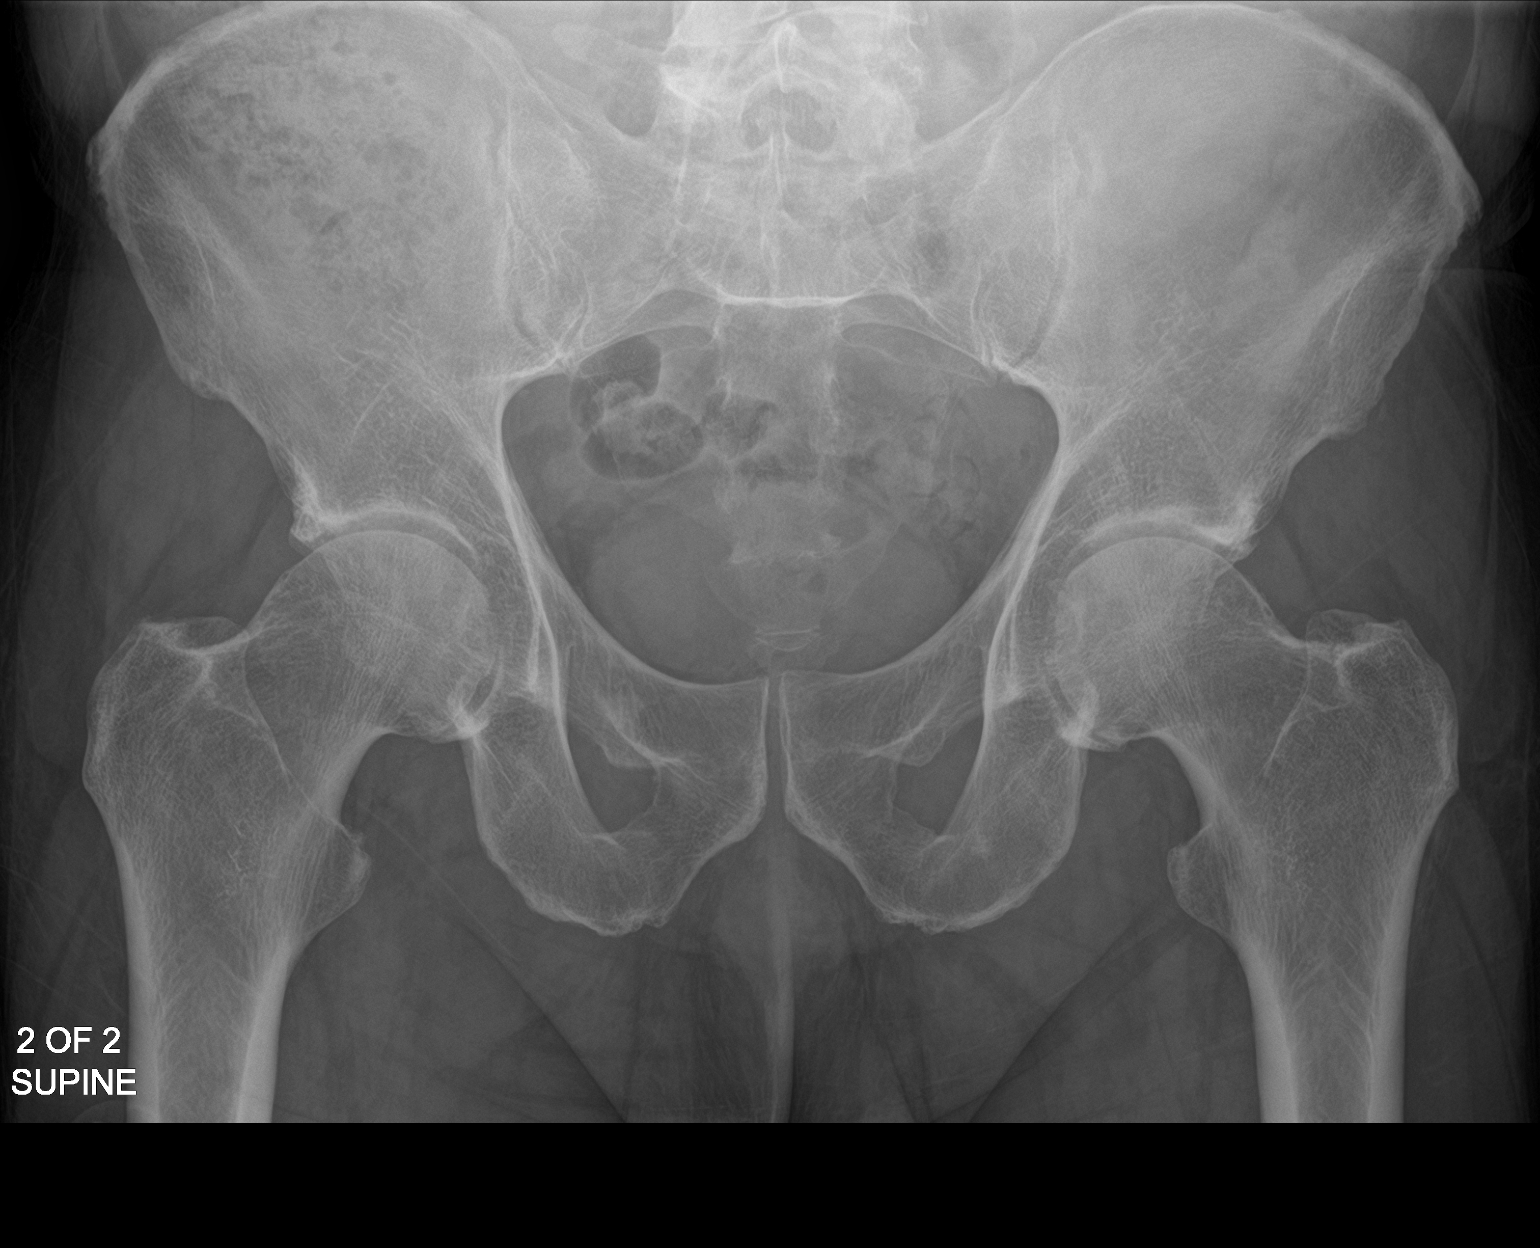

[2 of 2 positions shown; findings below may reference images not displayed]

FINDINGS: The bowel gas pattern is normal. No radio-opaque calculi or other
significant radiographic abnormality are seen.
IMPRESSION: No nephrolithiasis visualized.

## 2023-09-06 ENCOUNTER — Other Ambulatory Visit: Payer: Self-pay | Admitting: Urology

## 2023-09-06 DIAGNOSIS — N401 Enlarged prostate with lower urinary tract symptoms: Secondary | ICD-10-CM

## 2023-10-05 NOTE — Progress Notes (Unsigned)
 10/08/2023 8:45 AM   Terry Lowe 1955-09-25 [id number]  Referring provider: Rudolpho Norleen BIRCH, MD [address] [facility] [place],  [place] [address]  Urological history: 1.  Erectile dysfunction - testosterone  pending  - Tadalafil  5 mg daily  2. BPH w/ LU TS - PSA (11/2022) 2.28 - alfuzosin  10 mg daily   3. ED - sildenafil  100 mg, on-demand dosing -severe sinus irritation - Tadalafil  5 mg daily  4. Premature ejaculation  No chief complaint on file.  HPI: Terry Lowe is a 68 y.o. male who presents today for medication refill.   Previous records reviewed.     Serum creatinine (05/2023) 0.9, eGFR 94  PSA (11/2022) 2.28  PMH: Past Medical History:  Diagnosis Date   BPH (benign prostatic hyperplasia)    CAD (coronary artery disease)    Erectile dysfunction    HTN (hypertension)    Hyperlipidemia    Hypogonadism in male    Low testosterone     with replacement   Squamous cell carcinoma    neck mass- surgery- radical neck dissection/radiation and chemo   Varicose vein    L lower leg- pain    Surgical History: Past Surgical History:  Procedure Laterality Date   Cath (other)  2007   hospital for chest pain   COLONOSCOPY WITH PROPOFOL  N/A 11/22/2015   Procedure: COLONOSCOPY WITH PROPOFOL ;  Surgeon: Lamar ONEIDA Holmes, MD;  Location: [facility] ENDOSCOPY;  Service: Endoscopy;  Laterality: N/A;   CT of chest  9/10   atelectasis   CT of neck  9/10   enlarged lymph node   LN R neck  9/10   inconclusive fine needle bx   squamous cell carcinoma/metastatic     foundin neck 10/10 radical neck dissection   TSA  1960s    Home Medications:  Allergies as of 10/08/2023       Reactions   Pneumococcal Vaccine Polyvalent    REACTION: itching for over a year   Statins    REACTION: myalgia severe        Medication List        Accurate as of October 05, 2023  8:45 AM. If you have any questions, ask your nurse or doctor.          alfuzosin  10 MG  24 hr tablet Commonly known as: UROXATRAL  TAKE 1 TABLET BY MOUTH ONCE DAILY WITH BREAKFAST   amLODipine  5 MG tablet Commonly known as: NORVASC  Take 1 tablet (5 mg total) by mouth daily.   ascorbic acid 500 MG tablet Commonly known as: VITAMIN C Take 500 mg by mouth daily.   aspirin EC 81 MG tablet Take 81 mg by mouth daily.   b complex vitamins capsule Take 1 capsule by mouth daily.   B-12 PO Take 500 mcg by mouth daily.   enalapril  20 MG tablet Commonly known as: VASOTEC  Take 1 tablet (20 mg total) by mouth daily.   hydrochlorothiazide  25 MG tablet Commonly known as: HYDRODIURIL  TAKE 1 TABLET BY MOUTH ONCE DAILY   metoprolol  succinate 100 MG 24 hr tablet Commonly known as: TOPROL -XL Take 1 tablet (100 mg total) by mouth daily. Take with or immediately following a meal.   NEOMYCIN -POLYMYXIN-HC (OPHTH) Susp Apply 1 drop to eye 3 (three) times daily. To affected eye   Potassium 99 MG Tabs Take by mouth.   pyridOXINE 100 MG tablet Commonly known as: VITAMIN B6 Take 100 mg by mouth daily.   sildenafil  100 MG tablet Commonly  known as: VIAGRA  TAKE 1 TABLET BY MOUTH ONCE DAILY AS NEEDED FOR ERECTILE DYSFUNCTION   tadalafil  5 MG tablet Commonly known as: CIALIS  Take 1 tablet (5 mg total) by mouth daily as needed for erectile dysfunction.   testosterone  cypionate 200 MG/ML injection Commonly known as: DEPOTESTOSTERONE CYPIONATE Inject 1 mL (200 mg total) into the muscle every 14 (fourteen) days.   TURMERIC PO Take 1,000 mg by mouth daily.   VITAMIN D3 PO Take 1,000 Units by mouth daily.   vitamin E 180 MG (400 UNITS) capsule Take 400 Units by mouth daily.   zinc gluconate 50 MG tablet Take 50 mg by mouth daily.        Allergies:  Allergies  Allergen Reactions   Pneumococcal Vaccine Polyvalent     REACTION: itching for over a year   Statins     REACTION: myalgia severe    Family History: Family History  Problem Relation Age of Onset   Coronary  artery disease Father    Melanoma Father    Heart disease Father        CAD   Cancer Father        melanoma   Breast cancer Mother     Social History:  reports that he has quit smoking. He has never used smokeless tobacco. He reports current alcohol use. He reports that he does not use drugs.  ROS: Pertinent ROS in HPI  Physical Exam: There were no vitals taken for this visit.  Constitutional:  Well nourished. Alert and oriented, No acute distress. HEENT: [place] AT, moist mucus membranes.  Trachea midline, no masses. Cardiovascular: No clubbing, cyanosis, or edema. Respiratory: Normal respiratory effort, no increased work of breathing. GI: Abdomen is soft, non tender, non distended, no abdominal masses. Liver and spleen not palpable.  No hernias appreciated.  Stool sample for occult testing is not indicated.   GU: No CVA tenderness.  No bladder fullness or masses.  Patient with circumcised/uncircumcised phallus. ***Foreskin easily retracted***  Urethral meatus is patent.  No penile discharge. No penile lesions or rashes. Scrotum without lesions, cysts, rashes and/or edema.  Testicles are located scrotally bilaterally. No masses are appreciated in the testicles. Left and right epididymis are normal. Rectal: Patient with  normal sphincter tone. Anus and perineum without scarring or rashes. No rectal masses are appreciated. Prostate is approximately *** grams, *** nodules are appreciated. Seminal vesicles are normal. Skin: No rashes, bruises or suspicious lesions. Lymph: No cervical or inguinal adenopathy. Neurologic: Grossly intact, no focal deficits, moving all 4 extremities. Psychiatric: Normal mood and affect.   Laboratory Data: Pending   Pertinent Imaging: N/A  Assessment & Plan:    1. BPH with LU TS - stable, improving, worsening mild, moderate severe symptoms *** - no signs of retention, infection or malignancy *** - PSA up to date *** - DRE benign *** - UA benign *** - PVR  < 300 cc *** - most bothersome symptoms are *** - encouraged avoiding bladder irritants, fluid restriction before bedtime and timed voiding's - Initiate alpha-blocker (***), discussed side effects *** - Initiate 5 alpha reductase inhibitor (***), discussed side effects *** - Continue tamsulosin 0.4 mg daily, alfuzosin  10 mg daily, Rapaflo 8 mg daily, terazosin, doxazosin, Cialis  5 mg daily and finasteride 5 mg daily, dutasteride 0.5 mg daily***:refills given - Cannot tolerate medication or medication failure, schedule cystoscopy *** - educated on red flag symptoms: acute retention, gross hematuria, fever, severe pain - advised to call clinic or go  to the ED if these occur - return to clinic in *** symptom re-evaluation ***  2. ED - ***  3. Premature ejaculation - ***     No follow-ups on file.  These notes generated with voice recognition software. I apologize for typographical errors.  Terry Lowe  [facility] [address]  [address] [place], [place] [address] [phone]

## 2023-10-08 ENCOUNTER — Other Ambulatory Visit
Admission: RE | Admit: 2023-10-08 | Discharge: 2023-10-08 | Disposition: A | Discharge status: Home / Self Care | Attending: Urology | Admitting: Urology

## 2023-10-08 ENCOUNTER — Ambulatory Visit: Admitting: Urology

## 2023-10-08 ENCOUNTER — Encounter: Payer: Self-pay | Admitting: Urology

## 2023-10-08 VITALS — BP 162/80 | HR 74 | Ht 72.0 in | Wt 188.2 lb

## 2023-10-08 DIAGNOSIS — F524 Premature ejaculation: Secondary | ICD-10-CM | POA: Diagnosis not present

## 2023-10-08 DIAGNOSIS — N529 Male erectile dysfunction, unspecified: Secondary | ICD-10-CM

## 2023-10-08 DIAGNOSIS — N138 Other obstructive and reflux uropathy: Secondary | ICD-10-CM

## 2023-10-08 DIAGNOSIS — N401 Enlarged prostate with lower urinary tract symptoms: Secondary | ICD-10-CM | POA: Diagnosis not present

## 2023-10-08 MED ORDER — TADALAFIL 10 MG PO TABS: 10.0000 mg | ORAL_TABLET | Freq: Every day | ORAL | 11 refills | Status: AC | PRN

## 2023-10-09 ENCOUNTER — Ambulatory Visit: Payer: Self-pay | Admitting: Urology

## 2023-10-09 LAB — TESTOSTERONE: Testosterone: 383 ng/dL (ref 264–916)

## 2023-11-02 ENCOUNTER — Other Ambulatory Visit: Payer: Self-pay | Admitting: Urology

## 2023-11-02 DIAGNOSIS — N138 Other obstructive and reflux uropathy: Secondary | ICD-10-CM

## 2024-03-25 ENCOUNTER — Other Ambulatory Visit: Payer: Self-pay | Admitting: Urology

## 2024-03-25 DIAGNOSIS — N138 Other obstructive and reflux uropathy: Secondary | ICD-10-CM

## 2024-10-08 ENCOUNTER — Ambulatory Visit: Admitting: Urology
